# Patient Record
Sex: Female | Born: 1990 | Race: White | Hispanic: No | Marital: Married | Smoking: Never smoker
Health system: Southern US, Community
[De-identification: ages and names within clinical notes are randomized; demographics above are authoritative.]

## PROBLEM LIST (undated history)

## (undated) DIAGNOSIS — F32A Depression, unspecified: Secondary | ICD-10-CM

## (undated) DIAGNOSIS — O139 Gestational [pregnancy-induced] hypertension without significant proteinuria, unspecified trimester: Secondary | ICD-10-CM

## (undated) DIAGNOSIS — O24419 Gestational diabetes mellitus in pregnancy, unspecified control: Secondary | ICD-10-CM

## (undated) DIAGNOSIS — F419 Anxiety disorder, unspecified: Secondary | ICD-10-CM

## (undated) HISTORY — DX: Gestational diabetes mellitus in pregnancy, unspecified control: O24.419

## (undated) HISTORY — DX: Gestational (pregnancy-induced) hypertension without significant proteinuria, unspecified trimester: O13.9

## (undated) HISTORY — DX: Anxiety disorder, unspecified: F41.9

## (undated) HISTORY — DX: Depression, unspecified: F32.A

## (undated) HISTORY — PX: WISDOM TOOTH EXTRACTION: SHX21

---
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
## 2015-09-01 ENCOUNTER — Ambulatory Visit (INDEPENDENT_AMBULATORY_CARE_PROVIDER_SITE_OTHER): Payer: BLUE CROSS/BLUE SHIELD | Admitting: Obstetrics and Gynecology

## 2015-09-01 VITALS — BP 113/81 | HR 91 | Wt 179.2 lb

## 2015-09-01 DIAGNOSIS — N920 Excessive and frequent menstruation with regular cycle: Secondary | ICD-10-CM

## 2015-09-01 DIAGNOSIS — R102 Pelvic and perineal pain: Secondary | ICD-10-CM

## 2015-09-01 DIAGNOSIS — Z30432 Encounter for removal of intrauterine contraceptive device: Secondary | ICD-10-CM | POA: Diagnosis not present

## 2015-09-01 MED ORDER — DROSPIRENONE-ETHINYL ESTRADIOL 3-0.03 MG PO TABS: 1.0000 | ORAL_TABLET | Freq: Every day | ORAL | Status: DC

## 2015-09-01 NOTE — Progress Notes (Signed)
GYNECOLOGY PROCEDURE NOTE  Subjective:    Patient ID: Tina Dixon, female    DOB: 11-12-1991, 24 y.o.   MRN: [id number]  HPI Tina Dixon is a 24 y.o. P0 here for Paraguard IUD removal. Reports that  Since having IUD placed 8 months ago, she has had 4 months of pelvic pain, heavier periods and dysmenorrhea.  Also notes that previously she was able to lose weight when on OCPs, however now, despite working out daily, has had no further weight loss.   The following portions of the patient's history were reviewed and updated as appropriate: allergies, current medications, past family history, past medical history, past social history, past surgical history and problem list.  Review of Systems Pertinent items noted in HPI and remainder of comprehensive ROS otherwise negative.   Objective:   Blood pressure 113/81, pulse 91, weight 179 lb 3 oz (81.279 kg). General appearance: alert and no distress   IUD Removal  Patient identified, informed consent performed, consent signed.  Patient was in the dorsal lithotomy position, normal external genitalia was noted.  A speculum was placed in the patient's vagina, normal discharge was noted, no lesions. The cervix was visualized, no lesions, no abnormal discharge.  The strings of the IUD were grasped and pulled using ring forceps. The IUD was removed in its entirety.  Patient tolerated the procedure well.     Assessment:   Encounter for IUD removal Pelvic pain Heavy menses  Plan:   IUD removed without difficulty. Patient will use OCPs for contraception (has used Yaz in the past and would like to resume)  Routine preventative health maintenance measures emphasized.   Hildred LaserAnika Ericberto Padget, MD [facility]

## 2016-01-08 ENCOUNTER — Emergency Department
Admission: EM | Admit: 2016-01-08 | Discharge: 2016-01-08 | Disposition: A | Discharge status: Home / Self Care | Payer: BLUE CROSS/BLUE SHIELD | Attending: Emergency Medicine | Admitting: Emergency Medicine

## 2016-01-08 ENCOUNTER — Encounter: Payer: Self-pay | Admitting: Emergency Medicine

## 2016-01-08 DIAGNOSIS — R000 Tachycardia, unspecified: Secondary | ICD-10-CM | POA: Insufficient documentation

## 2016-01-08 DIAGNOSIS — R05 Cough: Secondary | ICD-10-CM | POA: Diagnosis present

## 2016-01-08 DIAGNOSIS — Z793 Long term (current) use of hormonal contraceptives: Secondary | ICD-10-CM | POA: Insufficient documentation

## 2016-01-08 DIAGNOSIS — J101 Influenza due to other identified influenza virus with other respiratory manifestations: Secondary | ICD-10-CM

## 2016-01-08 LAB — RAPID INFLUENZA A&B ANTIGENS
Influenza A (ARMC): DETECTED
Influenza B (ARMC): NOT DETECTED

## 2016-01-08 MED ORDER — ACETAMINOPHEN 500 MG PO TABS
ORAL_TABLET | ORAL | Status: AC
  Administered 2016-01-08: 1000 mg via ORAL
  Filled 2016-01-08: qty 2

## 2016-01-08 MED ORDER — PSEUDOEPH-BROMPHEN-DM 30-2-10 MG/5ML PO SYRP: 5.0000 mL | ORAL_SOLUTION | Freq: Four times a day (QID) | ORAL | Status: DC | PRN

## 2016-01-08 MED ORDER — KETOROLAC TROMETHAMINE 60 MG/2ML IM SOLN
60.0000 mg | Freq: Once | INTRAMUSCULAR | Status: AC
  Administered 2016-01-08: 60 mg via INTRAMUSCULAR
  Filled 2016-01-08: qty 2

## 2016-01-08 MED ORDER — OSELTAMIVIR PHOSPHATE 75 MG PO CAPS: 75.0000 mg | ORAL_CAPSULE | Freq: Two times a day (BID) | ORAL | Status: AC

## 2016-01-08 MED ORDER — ACETAMINOPHEN 500 MG PO TABS
1000.0000 mg | ORAL_TABLET | Freq: Once | ORAL | Status: AC
  Administered 2016-01-08: 1000 mg via ORAL

## 2016-01-08 MED ORDER — IBUPROFEN 800 MG PO TABS: 800.0000 mg | ORAL_TABLET | Freq: Three times a day (TID) | ORAL | Status: DC | PRN

## 2016-01-08 NOTE — ED Notes (Signed)
Cough and fever, T 103.2 at home today.

## 2016-01-08 NOTE — Discharge Instructions (Signed)
Influenza, Adult Influenza (flu) is an infection in the mouth, nose, and throat (respiratory tract) caused by a virus. The flu can make you feel very ill. Influenza spreads easily from person to person (contagious).  HOME CARE   Only take medicines as told by your doctor.  Use a cool mist humidifier to make breathing easier.  Get plenty of rest until your fever goes away. This usually takes 3 to 4 days.  Drink enough fluids to keep your pee (urine) clear or pale yellow.  Cover your mouth and nose when you cough or sneeze.  Wash your hands well to avoid spreading the flu.  Stay home from work or school until your fever has been gone for at least 1 full day.  Get a flu shot every year. GET HELP RIGHT AWAY IF:   You have trouble breathing or feel short of breath.  Your skin or nails turn blue.  You have severe neck pain or stiffness.  You have a severe headache, facial pain, or earache.  Your fever gets worse or keeps coming back.  You feel sick to your stomach (nauseous), throw up (vomit), or have watery poop (diarrhea).  You have chest pain.  You have a deep cough that gets worse, or you cough up more thick spit (mucus). MAKE SURE YOU:   Understand these instructions.  Will watch your condition.  Will get help right away if you are not doing well or get worse.   This information is not intended to replace advice given to you by your health care provider. Make sure you discuss any questions you have with your health care provider.   Document Released: 08/08/2008 Document Revised: 11/20/2014 Document Reviewed: 01/29/2012 Elsevier Interactive Patient Education 2016 Elsevier Inc.  

## 2016-01-08 NOTE — ED Provider Notes (Signed)
[facility] Emergency Department Provider Note  ____________________________________________  Time seen: Approximately 8:24 PM  I have reviewed the triage vital signs and the nursing notes.   HISTORY  Chief Complaint Cough    HPI Tina Dixon is a 25 y.o. female patient complain of fever sinus and chest congestion and body aches and a nonproductive cough. He also complaining of sore throat. Onset of complaint was last night. Patient denies any nausea vomiting diarrhea.No palliative measures taken for this complaint prior to arrival to the ER. Patient given Tylenol in triage. Patient states she did not take the flu shot this season. Patient rates her pain discomfort as a 5/10. Patient describes the pain as "achy".   History reviewed. No pertinent past medical history.  There are no active problems to display for this patient.   History reviewed. No pertinent past surgical history.  Current Outpatient Rx  Name  Route  Sig  Dispense  Refill  . brompheniramine-pseudoephedrine-DM 30-2-10 MG/5ML syrup   Oral   Take 5 mLs by mouth 4 (four) times daily as needed.   120 mL   0   . drospirenone-ethinyl estradiol (YASMIN 28) 3-0.03 MG tablet   Oral   Take 1 tablet by mouth daily.   1 Package   11   . ibuprofen (ADVIL,MOTRIN) 800 MG tablet   Oral   Take 1 tablet (800 mg total) by mouth every 8 (eight) hours as needed.   30 tablet   0   . oseltamivir (TAMIFLU) 75 MG capsule   Oral   Take 1 capsule (75 mg total) by mouth 2 (two) times daily.   20 capsule   0     Allergies Review of patient's allergies indicates no known allergies.  No family history on file.  Social History Social History  Substance Use Topics  . Smoking status: Never Smoker   . Smokeless tobacco: None  . Alcohol Use: No    Review of Systems Constitutional: Fever and chills. Body ache Eyes: No visual changes. ENT: Sore throat  Cardiovascular: Denies chest  pain. Respiratory: Denies shortness of breath. Nonproductive cough Gastrointestinal: No abdominal pain.  No nausea, no vomiting.  No diarrhea.  No constipation. Genitourinary: Negative for dysuria. Musculoskeletal: Negative for back pain. Skin: Negative for rash. Neurological: Positive for headaches, but denies focal weakness or numbness.    ____________________________________________   PHYSICAL EXAM:  VITAL SIGNS: ED Triage Vitals  Enc Vitals Group     BP 01/08/16 1901 122/74 mmHg     Pulse Rate 01/08/16 1901 130     Resp 01/08/16 1901 20     Temp 01/08/16 1901 102.3 F (39.1 C)     Temp Source 01/08/16 1901 Oral     SpO2 01/08/16 1901 95 %     Weight 01/08/16 1901 179 lb (81.194 kg)     Height 01/08/16 1901  (1.6 m)     Head Cir --      Peak Flow --      Pain Score 01/08/16 1903 5     Pain Loc --      Pain Edu? --      Excl. in GC? --     Constitutional: Alert and oriented. Well appearing and in no acute distress. Patient was febrile in triage status post Tylenol fever resolved. Eyes: Conjunctivae are normal. PERRL. EOMI. Head: Atraumatic. Nose: No congestion/rhinnorhea. Mouth/Throat: Mucous membranes are moist.  Oropharynx non-erythematous. Neck: No stridor.  No cervical spine tenderness to  palpation. Hematological/Lymphatic/Immunilogical: No cervical lymphadenopathy. Cardiovascular: Normal rate, regular rhythm. Grossly normal heart sounds.  Good peripheral circulation. Patient initially tachycardic status post Tylenol resistant fever pulse is now 109. Respiratory: Normal respiratory effort.  No retractions. Lungs CTAB. Gastrointestinal: Soft and nontender. No distention. No abdominal bruits. No CVA tenderness. Musculoskeletal: No lower extremity tenderness nor edema.  No joint effusions. Neurologic:  Normal speech and language. No gross focal neurologic deficits are appreciated. No gait instability. Skin:  Skin is warm, dry and intact. No rash  noted. Psychiatric: Mood and affect are normal. Speech and behavior are normal.  ____________________________________________   LABS (all labs ordered are listed, but only abnormal results are displayed)  Labs Reviewed  RAPID INFLUENZA A&B ANTIGENS ([facility] ONLY)   ____________________________________________  EKG   ____________________________________________  RADIOLOGY   ____________________________________________   PROCEDURES  Procedure(s) performed: None  Critical Care performed: No  ____________________________________________   INITIAL IMPRESSION / ASSESSMENT AND PLAN / ED COURSE  Pertinent labs & imaging results that were available during my care of the patient were reviewed by me and considered in my medical decision making (see chart for details).  Positive for influenza A. She given discharge care instructions. Patient given a prescription for Tamiflu, Bromfed-DM, and ibuprofen. Patient given a work excuse for 2 days. Patient advised [facility] no [facility] in 3-5 days. ____________________________________________   FINAL CLINICAL IMPRESSION(S) / ED DIAGNOSES  Final diagnoses:  Influenza A      Joni Reining, PA-C 01/08/16 2121  Jennye Moccasin, MD 01/08/16 2123

## 2016-01-08 NOTE — ED Notes (Addendum)
Pt c/o of fever (103), chest congestion, sinus congestion, SOB, generalized body aches and previous dry cough.

## 2016-02-15 DIAGNOSIS — Z3009 Encounter for other general counseling and advice on contraception: Secondary | ICD-10-CM | POA: Insufficient documentation

## 2016-02-16 ENCOUNTER — Ambulatory Visit (INDEPENDENT_AMBULATORY_CARE_PROVIDER_SITE_OTHER): Payer: BLUE CROSS/BLUE SHIELD | Admitting: Unknown Physician Specialty

## 2016-02-16 ENCOUNTER — Encounter: Payer: Self-pay | Admitting: Unknown Physician Specialty

## 2016-02-16 VITALS — BP 128/89 | HR 97 | Temp 98.5°F | Ht 61.8 in | Wt 176.4 lb

## 2016-02-16 DIAGNOSIS — F329 Major depressive disorder, single episode, unspecified: Secondary | ICD-10-CM

## 2016-02-16 DIAGNOSIS — F322 Major depressive disorder, single episode, severe without psychotic features: Secondary | ICD-10-CM | POA: Insufficient documentation

## 2016-02-16 MED ORDER — CITALOPRAM HYDROBROMIDE 20 MG PO TABS: 20.0000 mg | ORAL_TABLET | Freq: Every day | ORAL | Status: DC

## 2016-02-16 MED ORDER — LORAZEPAM 0.5 MG PO TABS: 0.5000 mg | ORAL_TABLET | Freq: Two times a day (BID) | ORAL | Status: DC | PRN

## 2016-02-16 NOTE — Progress Notes (Signed)
   BP 128/89 mmHg  Pulse 97  Temp(Src) 98.5 F (36.9 C)  Ht 5' 1.8" (1.57 m)  Wt 176 lb 6.4 oz (80.015 kg)  BMI 32.46 kg/m2  SpO2 99%  LMP 02/02/2016 (Approximate)   Subjective:    Patient ID: Tina Dixon, female    DOB: 12/29/1990, 25 y.o.   MRN: [id number]  HPI: Tina Dixon is a 25 y.o. female  Chief Complaint  Patient presents with  . Depression  . Anxiety   Depression Pt feeling down and hopeless for about a year.  She states her husband left her in the last 2 days but denies any marital problems before this.  She states she has no suicidal ideation.  She never had this feeling before the past year.    Depression screen PHQ 2/9 02/16/2016  Decreased Interest 3  Down, Depressed, Hopeless 3  PHQ - 2 Score 6  Altered sleeping 3  Tired, decreased energy 3  Change in appetite 2  Feeling bad or failure about yourself  3  Trouble concentrating 3  Moving slowly or fidgety/restless 2  Suicidal thoughts 0  PHQ-9 Score 22      Relevant past medical, surgical, family and social history reviewed and updated as indicated. Interim medical history since our last visit reviewed. Allergies and medications reviewed and updated.  Review of Systems  Per HPI unless specifically indicated above     Objective:    BP 128/89 mmHg  Pulse 97  Temp(Src) 98.5 F (36.9 C)  Ht 5' 1.8" (1.57 m)  Wt 176 lb 6.4 oz (80.015 kg)  BMI 32.46 kg/m2  SpO2 99%  LMP 02/02/2016 (Approximate)  Wt Readings from Last 3 Encounters:  02/16/16 176 lb 6.4 oz (80.015 kg)  11/17/14 150 lb (68.04 kg)  01/08/16 179 lb (81.194 kg)    Physical Exam  Constitutional: She is oriented to person, place, and time. She appears well-developed and well-nourished. No distress.  HENT:  Head: Normocephalic and atraumatic.  Eyes: Conjunctivae and lids are normal. Right eye exhibits no discharge. Left eye exhibits no discharge. No scleral icterus.  Cardiovascular: Normal rate.   Pulmonary/Chest: Effort  normal.  Abdominal: Normal appearance. There is no splenomegaly or hepatomegaly.  Musculoskeletal: Normal range of motion.  Neurological: She is alert and oriented to person, place, and time.  Skin: Skin is intact. No rash noted. No pallor.  Psychiatric: Her speech is normal and behavior is normal. Judgment and thought content normal. Cognition and memory are normal. She exhibits a depressed mood.       Assessment & Plan:   Problem List Items Addressed This Visit      Unprioritized   Severe depression - Primary    Discussed counseling.  Start Citalopram 20 mg daily.  Rx for Lorazepam .5 mg for night time use      Relevant Medications   citalopram (CELEXA) 20 MG tablet   LORazepam (ATIVAN) 0.5 MG tablet       Follow up plan: Return in about 4 weeks (around 03/15/2016).

## 2016-02-16 NOTE — Patient Instructions (Signed)
Go to Psychology Today and Find a Counselor website Consider [facility]

## 2016-02-16 NOTE — Assessment & Plan Note (Signed)
Discussed counseling.  Start Citalopram 20 mg daily.  Rx for Lorazepam .5 mg for night time use

## 2016-03-15 ENCOUNTER — Ambulatory Visit: Payer: BLUE CROSS/BLUE SHIELD | Admitting: Unknown Physician Specialty

## 2016-06-09 ENCOUNTER — Encounter: Payer: Self-pay | Admitting: Unknown Physician Specialty

## 2016-06-09 ENCOUNTER — Ambulatory Visit (INDEPENDENT_AMBULATORY_CARE_PROVIDER_SITE_OTHER): Payer: Medicaid Other | Admitting: Unknown Physician Specialty

## 2016-06-09 VITALS — BP 112/81 | HR 91 | Temp 98.3°F | Ht 63.1 in | Wt 168.0 lb

## 2016-06-09 DIAGNOSIS — Z202 Contact with and (suspected) exposure to infections with a predominantly sexual mode of transmission: Secondary | ICD-10-CM | POA: Diagnosis not present

## 2016-06-09 DIAGNOSIS — Z3009 Encounter for other general counseling and advice on contraception: Secondary | ICD-10-CM

## 2016-06-09 NOTE — Progress Notes (Signed)
   BP 112/81 (BP Location: Left Arm, Patient Position: Sitting, Cuff Size: Normal)   Pulse 91   Temp 98.3 F (36.8 C)   Ht 5' 3.1" (1.603 m)   Wt 168 lb (76.2 kg)   LMP 06/03/2016 (Exact Date)   SpO2 99%   BMI 29.67 kg/m    Subjective:    Patient ID: Tina Dixon, female    DOB: 1991/04/08, 25 y.o.   MRN: [id number]  HPI: Tina Dixon is a 25 y.o. female  Chief Complaint  Patient presents with  . birth control change    interested in implant   Pt states she had a copper IUD which was removed due to multiple issues such as bleeding.  She does not have problems with BCPs but can't remember to take them.  She is interested in getting a Nexplanon.  Pt states her husband was unfaithful and would like to get STD checks.    Relevant past medical, surgical, family and social history reviewed and updated as indicated. Interim medical history since our last visit reviewed. Allergies and medications reviewed and updated.  Review of Systems  Per HPI unless specifically indicated above     Objective:    BP 112/81 (BP Location: Left Arm, Patient Position: Sitting, Cuff Size: Normal)   Pulse 91   Temp 98.3 F (36.8 C)   Ht 5' 3.1" (1.603 m)   Wt 168 lb (76.2 kg)   LMP 06/03/2016 (Exact Date)   SpO2 99%   BMI 29.67 kg/m   Wt Readings from Last 3 Encounters:  06/09/16 168 lb (76.2 kg)  02/16/16 176 lb 6.4 oz (80 kg)  11/17/14 150 lb (68 kg)    Physical Exam  Constitutional: She is oriented to person, place, and time. She appears well-developed and well-nourished. No distress.  HENT:  Head: Normocephalic and atraumatic.  Eyes: Conjunctivae and lids are normal. Right eye exhibits no discharge. Left eye exhibits no discharge. No scleral icterus.  Cardiovascular: Normal rate.   Pulmonary/Chest: Effort normal.  Abdominal: Normal appearance. There is no splenomegaly or hepatomegaly.  Musculoskeletal: Normal range of motion.  Neurological: She is alert and oriented to person,  place, and time.  Skin: Skin is intact. No rash noted. No pallor.  Psychiatric: She has a normal mood and affect. Her behavior is normal. Judgment and thought content normal.    Results for orders placed or performed during the hospital encounter of 01/08/16  Rapid Influenza A&B Antigens ([facility] only)  Result Value Ref Range   Influenza A ([facility]) DETECTED    Influenza B [facility]) NOT DETECTED       Assessment & Plan:   Problem List Items Addressed This Visit      Unprioritized   Family planning counseling    Refer to Gyn for Nexplanon      Relevant Orders   Ambulatory referral to Gynecology    Other Visit Diagnoses    Possible exposure to STD    -  Primary   Relevant Orders   GC/Chlamydia Probe Amp   HIV antibody   RPR   HSV(herpes simplex vrs) 1+2 ab-IgG       Follow up plan: Return in about 1 year (around 06/09/2017).

## 2016-06-09 NOTE — Assessment & Plan Note (Signed)
Refer to Gyn for Nexplanon

## 2016-06-10 LAB — HSV(HERPES SIMPLEX VRS) I + II AB-IGG
HSV 1 Glycoprotein G Ab, IgG: <0.91 index (ref 0.00–0.90)
HSV 2 Glycoprotein G Ab, IgG: <0.91 index (ref 0.00–0.90)

## 2016-06-10 LAB — RPR: RPR Ser Ql: NONREACTIVE

## 2016-06-10 LAB — HIV ANTIBODY (ROUTINE TESTING W REFLEX): HIV Screen 4th Generation wRfx: NONREACTIVE

## 2016-06-13 LAB — GC/CHLAMYDIA PROBE AMP
Chlamydia trachomatis, NAA: NEGATIVE
Neisseria gonorrhoeae by PCR: NEGATIVE

## 2016-06-14 NOTE — Progress Notes (Signed)
Notified pt by mychart

## 2016-08-03 ENCOUNTER — Other Ambulatory Visit: Payer: Self-pay | Admitting: Obstetrics and Gynecology

## 2016-08-03 NOTE — Telephone Encounter (Signed)
Patient needs to schedule an appointment in the office as it has been almost a year since last visit.

## 2016-08-16 ENCOUNTER — Ambulatory Visit: Payer: Medicaid Other | Admitting: Obstetrics and Gynecology

## 2016-10-20 ENCOUNTER — Encounter: Payer: Self-pay | Admitting: Unknown Physician Specialty

## 2016-10-20 ENCOUNTER — Ambulatory Visit (INDEPENDENT_AMBULATORY_CARE_PROVIDER_SITE_OTHER): Payer: Self-pay | Admitting: Unknown Physician Specialty

## 2016-10-20 VITALS — BP 119/79 | HR 80 | Temp 97.5°F | Ht 62.5 in | Wt 176.2 lb

## 2016-10-20 DIAGNOSIS — Z23 Encounter for immunization: Secondary | ICD-10-CM

## 2016-10-20 DIAGNOSIS — N760 Acute vaginitis: Secondary | ICD-10-CM

## 2016-10-20 MED ORDER — FLUCONAZOLE 150 MG PO TABS: 150.0000 mg | ORAL_TABLET | Freq: Once | ORAL | 0 refills | Status: AC

## 2016-10-20 NOTE — Progress Notes (Signed)
BP 119/79 (BP Location: Left Arm, Patient Position: Sitting, Cuff Size: Large)   Pulse 80   Temp 97.5 F (36.4 C)   Ht 5' 2.5" (1.588 m)   Wt 176 lb 3.2 oz (79.9 kg)   LMP 09/04/2016 (Approximate)   SpO2 100%   BMI 31.71 kg/m    Subjective:    Patient ID: Tina Dixon, female    DOB: [birth year], 25 y.o.   MRN: [id number]  HPI: Tina Dixon is a 25 y.o. female  Chief Complaint  Patient presents with  . Vaginal Discharge    brownish color discharge for 1 week   . Vaginal Itching    for 1 week   Vaginal discharge Pt with irregular menses and a dark brown discharge like "it's at the end of my period."  She is also experiencing vaginal itching.  Nexplanon placed a few months ago.  She has a new partner who she says was "tested as clean."  No pain with intercourse.    Relevant past medical, surgical, family and social history reviewed and updated as indicated. Interim medical history since our last visit reviewed. Allergies and medications reviewed and updated.  Review of Systems  Per HPI unless specifically indicated above     Objective:    BP 119/79 (BP Location: Left Arm, Patient Position: Sitting, Cuff Size: Large)   Pulse 80   Temp 97.5 F (36.4 C)   Ht 5' 2.5" (1.588 m)   Wt 176 lb 3.2 oz (79.9 kg)   LMP 09/04/2016 (Approximate)   SpO2 100%   BMI 31.71 kg/m   Wt Readings from Last 3 Encounters:  10/20/16 176 lb 3.2 oz (79.9 kg)  06/09/16 168 lb (76.2 kg)  02/16/16 176 lb 6.4 oz (80 kg)    Physical Exam  Constitutional: She is oriented to person, place, and time. She appears well-developed and well-nourished. No distress.  HENT:  Head: Normocephalic and atraumatic.  Eyes: Conjunctivae and lids are normal. Right eye exhibits no discharge. Left eye exhibits no discharge. No scleral icterus.  Cardiovascular: Normal rate.   Pulmonary/Chest: Effort normal.  Abdominal: Normal appearance. There is no splenomegaly or hepatomegaly.  Genitourinary: Cervix  exhibits discharge. There is erythema in the vagina.  Genitourinary Comments: Blood discharge.    Musculoskeletal: Normal range of motion.  Neurological: She is alert and oriented to person, place, and time.  Skin: Skin is intact. No rash noted. No pallor.  Psychiatric: She has a normal mood and affect. Her behavior is normal. Judgment and thought content normal.    Results for orders placed or performed in visit on 06/09/16  GC/Chlamydia Probe Amp  Result Value Ref Range   Chlamydia trachomatis, NAA Negative Negative   Neisseria gonorrhoeae by PCR Negative Negative  HIV antibody  Result Value Ref Range   HIV Screen 4th Generation wRfx Non Reactive Non Reactive  RPR  Result Value Ref Range   RPR Ser Ql Non Reactive Non Reactive  HSV(herpes simplex vrs) 1+2 ab-IgG  Result Value Ref Range   HSV 1 Glycoprotein G Ab, IgG <0.91 0.00 - 0.90 index   HSV 2 Glycoprotein G Ab, IgG <0.91 0.00 - 0.90 index       Assessment & Plan:   Problem List Items Addressed This Visit    None    Visit Diagnoses    Need for influenza vaccination    -  Primary   Relevant Orders   Flu Vaccine QUAD 36+ mos IM (Completed)  Acute vaginitis       Pt reassured that the discharge is normal s/p Nexplanon.  Rx for Diflucan       Follow up plan: Return if symptoms worsen or fail to improve.

## 2016-10-20 NOTE — Patient Instructions (Signed)

## 2017-06-15 ENCOUNTER — Encounter: Payer: Self-pay | Admitting: Unknown Physician Specialty

## 2017-07-18 ENCOUNTER — Encounter: Payer: Self-pay | Admitting: Obstetrics and Gynecology

## 2017-07-18 ENCOUNTER — Ambulatory Visit (INDEPENDENT_AMBULATORY_CARE_PROVIDER_SITE_OTHER): Payer: No Typology Code available for payment source | Admitting: Obstetrics and Gynecology

## 2017-07-18 VITALS — BP 129/84 | HR 110 | Ht 63.0 in | Wt 192.6 lb

## 2017-07-18 DIAGNOSIS — Z3046 Encounter for surveillance of implantable subdermal contraceptive: Secondary | ICD-10-CM | POA: Diagnosis not present

## 2017-07-18 MED ORDER — DROSPIRENONE-ETHINYL ESTRADIOL 3-0.02 MG PO TABS: 1.0000 | ORAL_TABLET | Freq: Every day | ORAL | 11 refills | Status: DC

## 2017-07-18 NOTE — Patient Instructions (Addendum)
NEXPLANON PLACEMENT POST-PROCEDURE INSTRUCTIONS  1. You may take Ibuprofen, Aleve or Tylenol for pain if needed.  Pain should resolve within in 24 hours.  You may have intercourse after 24 hours. You should begin using your pills immediately.   2. You need to call if you have any fever, heavy bleeding, or redness at removal site. Irregular bleeding is common  after having a Nexplanon removed. You do not need to call for this reason unless you are concerned.  3. Shower or bathe as normal.  You can remove the bandage after 24 hours. 4.

## 2017-07-18 NOTE — Progress Notes (Signed)
     GYNECOLOGY OFFICE PROCEDURE NOTE  Tina Dixon is a 26 y.o. G0P0000 here for Nexplanon removal.  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 Last pap smear was ~ 2 years ago and was normal.  No other gynecologic concerns.   Nexplanon Removal Patient identified, informed consent performed, consent signed.   Appropriate time out taken. Nexplanon site identified in left arm.  Area prepped in usual sterile fashon. Two ml of 1% lidocaine was used to anesthetize the area at the distal end of the implant. A small stab incision was made right beside the implant on the distal portion.  The Nexplanon rod was grasped using hemostats and removed without difficulty.  There was minimal blood loss. There were no complications.  Steri-strips were applied over the small incision.  A pressure bandage was applied to reduce any bruising.  The patient tolerated the procedure well and was given post procedure instructions.  Patient is planning to use combined OCPs for contraception.  Prescription sent to pharmacy.

## 2018-05-20 ENCOUNTER — Encounter: Payer: Self-pay | Admitting: Unknown Physician Specialty

## 2018-05-20 ENCOUNTER — Ambulatory Visit: Payer: BLUE CROSS/BLUE SHIELD | Admitting: Unknown Physician Specialty

## 2018-05-20 VITALS — BP 128/87 | HR 87 | Temp 99.0°F | Ht 63.0 in | Wt 187.6 lb

## 2018-05-20 DIAGNOSIS — Z3009 Encounter for other general counseling and advice on contraception: Secondary | ICD-10-CM | POA: Diagnosis not present

## 2018-05-20 DIAGNOSIS — Z23 Encounter for immunization: Secondary | ICD-10-CM

## 2018-05-20 DIAGNOSIS — R5383 Other fatigue: Secondary | ICD-10-CM | POA: Diagnosis not present

## 2018-05-20 DIAGNOSIS — F324 Major depressive disorder, single episode, in partial remission: Secondary | ICD-10-CM | POA: Diagnosis not present

## 2018-05-20 MED ORDER — SPRINTEC 28 0.25-35 MG-MCG PO TABS: 1.0000 | ORAL_TABLET | Freq: Every day | ORAL | 4 refills | Status: DC

## 2018-05-20 NOTE — Patient Instructions (Signed)
Td Vaccine (Tetanus and Diphtheria): What You Need to Know 1. Why get vaccinated? Tetanus  and diphtheria are very serious diseases. They are rare in the [place] today, but people who do become infected often have severe complications. Td vaccine is used to protect adolescents and adults from both of these diseases. Both tetanus and diphtheria are infections caused by bacteria. Diphtheria spreads from person to person through coughing or sneezing. Tetanus-causing bacteria enter the body through cuts, scratches, or wounds. TETANUS (lockjaw) causes painful muscle tightening and stiffness, usually all over the body.  It can lead to tightening of muscles in the head and neck so you can't open your mouth, swallow, or sometimes even breathe. Tetanus kills about 1 out of every 10 people who are infected even after receiving the best medical care.  DIPHTHERIA can cause a thick coating to form in the back of the throat.  It can lead to breathing problems, paralysis, heart failure, and death.  Before vaccines, as many as 200,000 cases of diphtheria and hundreds of cases of tetanus were reported in the [place] each year. Since vaccination began, reports of cases for both diseases have dropped by about 99%. 2. Td vaccine Td vaccine can protect adolescents and adults from tetanus and diphtheria. Td is usually given as a booster dose every 10 years but it can also be given earlier after a severe and dirty wound or burn. Another vaccine, called Tdap, which protects against pertussis in addition to tetanus and diphtheria, is sometimes recommended instead of Td vaccine. Your doctor or the person giving you the vaccine can give you more information. Td may safely be given at the same time as other vaccines. 3. Some people should not get this vaccine  A person who has ever had a life-threatening allergic reaction after a previous dose of any tetanus or diphtheria containing vaccine, OR has a severe  allergy to any part of this vaccine, should not get Td vaccine. Tell the person giving the vaccine about any severe allergies.  Talk to your doctor if you: ? had severe pain or swelling after any vaccine containing diphtheria or tetanus, ? ever had a condition called Guillain Barre Syndrome (GBS), ? aren't feeling well on the day the shot is scheduled. 4. What are the risks from Td vaccine? With any medicine, including vaccines, there is a chance of side effects. These are usually mild and go away on their own. Serious reactions are also possible but are rare. Most people who get Td vaccine do not have any problems with it. Mild problems following Td vaccine: (Did not interfere with activities)  Pain where the shot was given (about 8 people in 10)  Redness or swelling where the shot was given (about 1 person in 4)  Mild fever (rare)  Headache (about 1 person in 4)  Tiredness (about 1 person in 4)  Moderate problems following Td vaccine: (Interfered with activities, but did not require medical attention)  Fever over 102F (rare)  Severe problems following Td vaccine: (Unable to perform usual activities; required medical attention)  Swelling, severe pain, bleeding and/or redness in the arm where the shot was given (rare).  Problems that could happen after any vaccine:  People sometimes faint after a medical procedure, including vaccination. Sitting or lying down for about 15 minutes can help prevent fainting, and injuries caused by a fall. Tell your doctor if you feel dizzy, or have vision changes or ringing in the ears.  Some people get   severe pain in the shoulder and have difficulty moving the arm where a shot was given. This happens very rarely.  Any medication can cause a severe allergic reaction. Such reactions from a vaccine are very rare, estimated at fewer than 1 in a million doses, and would happen within a few minutes to a few hours after the vaccination. As with any  medicine, there is a very remote chance of a vaccine causing a serious injury or death. The safety of vaccines is always being monitored. For more information, visit: www.cdc.gov/vaccinesafety/ 5. What if there is a serious reaction? What should I look for? Look for anything that concerns you, such as signs of a severe allergic reaction, very high fever, or unusual behavior. Signs of a severe allergic reaction can include hives, swelling of the face and throat, difficulty breathing, a fast heartbeat, dizziness, and weakness. These would usually start a few minutes to a few hours after the vaccination. What should I do?  If you think it is a severe allergic reaction or other emergency that can't wait, call 9-1-1 or get the person to the nearest hospital. Otherwise, call your doctor.  Afterward, the reaction should be reported to the Vaccine Adverse Event Reporting System (VAERS). Your doctor might file this report, or you can do it yourself through the VAERS web site at www.vaers.hhs.gov, or by calling 1-800-822-7967. ? VAERS does not give medical advice. 6. The National Vaccine Injury Compensation Program The National Vaccine Injury Compensation Program (VICP) is a federal program that was created to compensate people who may have been injured by certain vaccines. Persons who believe they may have been injured by a vaccine can learn about the program and about filing a claim by calling 1-800-338-2382 or visiting the VICP website at www.hrsa.gov/vaccinecompensation. There is a time limit to file a claim for compensation. 7. How can I learn more?  Ask your doctor. He or she can give you the vaccine package insert or suggest other sources of information.  Call your local or state health department.  Contact the Centers for Disease Control and Prevention (CDC): ? Call 1-800-232-4636 (1-800-CDC-INFO) ? Visit CDC's website at www.cdc.gov/vaccines CDC Td Vaccine VIS (02/22/16) This information is  not intended to replace advice given to you by your health care provider. Make sure you discuss any questions you have with your health care provider. Document Released: 08/27/2006 Document Revised: 07/20/2016 Document Reviewed: 07/20/2016 Elsevier Interactive Patient Education  2017 Elsevier Inc.  
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
## 2018-05-20 NOTE — Assessment & Plan Note (Addendum)
Significant fatigue for 2 years but new problem for me.  Check labs and f/u with physical

## 2018-05-20 NOTE — Assessment & Plan Note (Signed)
Refill BCPs.  Schedule for a PE

## 2018-05-20 NOTE — Assessment & Plan Note (Signed)
Wants no medications at this time. Encouraged counseling through company or insurance

## 2018-05-21 LAB — CBC WITH DIFFERENTIAL/PLATELET
Basophils Absolute: 0.1 10*3/uL (ref 0.0–0.2)
Basos: 1 %
EOS (ABSOLUTE): 0.2 10*3/uL (ref 0.0–0.4)
Eos: 2 %
Hematocrit: 42.1 % (ref 34.0–46.6)
Hemoglobin: 14 g/dL (ref 11.1–15.9)
Immature Grans (Abs): 0 10*3/uL (ref 0.0–0.1)
Immature Granulocytes: 0 %
Lymphocytes Absolute: 2.4 10*3/uL (ref 0.7–3.1)
Lymphs: 27 %
MCH: 31.5 pg (ref 26.6–33.0)
MCHC: 33.3 g/dL (ref 31.5–35.7)
MCV: 95 fL (ref 79–97)
Monocytes Absolute: 0.7 10*3/uL (ref 0.1–0.9)
Monocytes: 9 %
Neutrophils Absolute: 5.4 10*3/uL (ref 1.4–7.0)
Neutrophils: 61 %
Platelets: 423 10*3/uL (ref 150–450)
RBC: 4.44 x10E6/uL (ref 3.77–5.28)
RDW: 12.6 % (ref 12.3–15.4)
WBC: 8.7 10*3/uL (ref 3.4–10.8)

## 2018-05-21 LAB — LIPID PANEL W/O CHOL/HDL RATIO
Cholesterol, Total: 182 mg/dL (ref 100–199)
HDL: 82 mg/dL (ref >39)
LDL Calculated: 68 mg/dL (ref 0–99)
Triglycerides: 159 mg/dL — ABNORMAL HIGH (ref 0–149)
VLDL Cholesterol Cal: 32 mg/dL (ref 5–40)

## 2018-05-21 LAB — COMPREHENSIVE METABOLIC PANEL
ALT: 19 IU/L (ref 0–32)
AST: 15 IU/L (ref 0–40)
Albumin/Globulin Ratio: 1.2 (ref 1.2–2.2)
Albumin: 4.1 g/dL (ref 3.5–5.5)
Alkaline Phosphatase: 55 IU/L (ref 39–117)
BUN/Creatinine Ratio: 10 (ref 9–23)
BUN: 8 mg/dL (ref 6–20)
Bilirubin Total: 0.3 mg/dL (ref 0.0–1.2)
CO2: 21 mmol/L (ref 20–29)
Calcium: 9.4 mg/dL (ref 8.7–10.2)
Chloride: 102 mmol/L (ref 96–106)
Creatinine, Ser: 0.77 mg/dL (ref 0.57–1.00)
GFR calc Af Amer: 123 mL/min/{1.73_m2} (ref >59)
GFR calc non Af Amer: 107 mL/min/{1.73_m2} (ref >59)
Globulin, Total: 3.3 g/dL (ref 1.5–4.5)
Glucose: 77 mg/dL (ref 65–99)
Potassium: 4.3 mmol/L (ref 3.5–5.2)
Sodium: 140 mmol/L (ref 134–144)
Total Protein: 7.4 g/dL (ref 6.0–8.5)

## 2018-05-21 LAB — VITAMIN D 25 HYDROXY (VIT D DEFICIENCY, FRACTURES): Vit D, 25-Hydroxy: 27.7 ng/mL — ABNORMAL LOW (ref 30.0–100.0)

## 2018-05-21 LAB — TSH: TSH: 2.03 u[IU]/mL (ref 0.450–4.500)

## 2018-05-21 NOTE — Progress Notes (Signed)
Notified pt by mychart

## 2018-05-24 ENCOUNTER — Ambulatory Visit: Payer: No Typology Code available for payment source | Admitting: Unknown Physician Specialty

## 2018-06-27 ENCOUNTER — Encounter: Payer: BLUE CROSS/BLUE SHIELD | Admitting: Family Medicine

## 2018-09-27 ENCOUNTER — Encounter: Payer: Self-pay | Admitting: Unknown Physician Specialty

## 2018-09-27 ENCOUNTER — Ambulatory Visit (INDEPENDENT_AMBULATORY_CARE_PROVIDER_SITE_OTHER): Payer: BLUE CROSS/BLUE SHIELD | Admitting: Unknown Physician Specialty

## 2018-09-27 VITALS — BP 143/94 | HR 94 | Temp 98.3°F | Wt 189.0 lb

## 2018-09-27 DIAGNOSIS — R519 Headache, unspecified: Secondary | ICD-10-CM

## 2018-09-27 DIAGNOSIS — R51 Headache: Secondary | ICD-10-CM | POA: Diagnosis not present

## 2018-09-27 DIAGNOSIS — J00 Acute nasopharyngitis [common cold]: Secondary | ICD-10-CM | POA: Diagnosis not present

## 2018-09-27 NOTE — Progress Notes (Signed)
BP (!) 143/94   Pulse 94   Temp 98.3 F (36.8 C) (Oral)   Wt 189 lb (85.7 kg)   SpO2 99%   BMI 33.48 kg/m    Subjective:    Patient ID: Tina Dixon, female    DOB: [birth year], 27 y.o.   MRN: [id number]  HPI: Tina Dixon is a 27 y.o. female  Chief Complaint  Patient presents with  . Headache    x days ago  . Sore Throat    Work requires DRs note Was out yesterday and today   URI   This is a new problem. Episode onset: Headache started 3 days ago and sore throat yesterday and nasal congestion following sore throat. There has been no fever. The fever has been present for less than 1 day. Associated symptoms include congestion, coughing, ear pain, headaches, rhinorrhea and a sore throat. Pertinent negatives include no abdominal pain, chest pain, diarrhea, dysuria, joint pain, joint swelling, nausea, neck pain, plugged ear sensation, rash, sinus pain, sneezing, vomiting or wheezing. She has tried acetaminophen for the symptoms. The treatment provided mild relief.    Relevant past medical, surgical, family and social history reviewed and updated as indicated. Interim medical history since our last visit reviewed. Allergies and medications reviewed and updated.  Review of Systems  HENT: Positive for congestion, ear pain, rhinorrhea and sore throat. Negative for sinus pain and sneezing.   Respiratory: Positive for cough. Negative for wheezing.   Cardiovascular: Negative for chest pain.  Gastrointestinal: Negative for abdominal pain, diarrhea, nausea and vomiting.  Genitourinary: Negative for dysuria.  Musculoskeletal: Negative for joint pain and neck pain.  Skin: Negative for rash.  Neurological: Positive for headaches.       Headache worsens with bending forward    Per HPI unless specifically indicated above     Objective:    BP (!) 143/94   Pulse 94   Temp 98.3 F (36.8 C) (Oral)   Wt 189 lb (85.7 kg)   SpO2 99%   BMI 33.48 kg/m   Wt Readings from Last 3  Encounters:  09/27/18 189 lb (85.7 kg)  05/20/18 187 lb 9.6 oz (85.1 kg)  07/18/17 192 lb 9.6 oz (87.4 kg)    Physical Exam  Constitutional: She is oriented to person, place, and time. She appears well-developed and well-nourished. No distress.  HENT:  Head: Normocephalic and atraumatic.  Right Ear: Tympanic membrane and ear canal normal.  Left Ear: Tympanic membrane and ear canal normal.  Nose: Rhinorrhea present. Right sinus exhibits no maxillary sinus tenderness and no frontal sinus tenderness. Left sinus exhibits no maxillary sinus tenderness and no frontal sinus tenderness.  Mouth/Throat: Mucous membranes are normal. Posterior oropharyngeal erythema present.  Eyes: Conjunctivae and lids are normal. Right eye exhibits no discharge. Left eye exhibits no discharge. No scleral icterus.  Cardiovascular: Normal rate and regular rhythm.  Pulmonary/Chest: Effort normal and breath sounds normal. No respiratory distress.  Abdominal: Normal appearance. There is no splenomegaly or hepatomegaly.  Musculoskeletal: Normal range of motion.  Neurological: She is alert and oriented to person, place, and time.  Skin: Skin is intact. No rash noted. No pallor.  Psychiatric: She has a normal mood and affect. Her behavior is normal. Judgment and thought content normal.    Results for orders placed or performed in visit on 05/20/18  CBC with Differential/Platelet  Result Value Ref Range   WBC 8.7 3.4 - 10.8 x10E3/uL   RBC 4.44 3.77 - 5.28 x10E6/uL  Hemoglobin 14.0 11.1 - 15.9 g/dL   Hematocrit 16.142.1 09.634.0 - 46.6 %   MCV 95 79 - 97 fL   MCH 31.5 26.6 - 33.0 pg   MCHC 33.3 31.5 - 35.7 g/dL   RDW 04.512.6 40.912.3 - 81.115.4 %   Platelets 423 150 - 450 x10E3/uL   Neutrophils 61 Not Estab. %   Lymphs 27 Not Estab. %   Monocytes 9 Not Estab. %   Eos 2 Not Estab. %   Basos 1 Not Estab. %   Neutrophils Absolute 5.4 1.4 - 7.0 x10E3/uL   Lymphocytes Absolute 2.4 0.7 - 3.1 x10E3/uL   Monocytes Absolute 0.7 0.1 - 0.9  x10E3/uL   EOS (ABSOLUTE) 0.2 0.0 - 0.4 x10E3/uL   Basophils Absolute 0.1 0.0 - 0.2 x10E3/uL   Immature Granulocytes 0 Not Estab. %   Immature Grans (Abs) 0.0 0.0 - 0.1 x10E3/uL  Comprehensive metabolic panel  Result Value Ref Range   Glucose 77 65 - 99 mg/dL   BUN 8 6 - 20 mg/dL   Creatinine, Ser 9.140.77 0.57 - 1.00 mg/dL   GFR calc non Af Amer 107 >59 mL/min/1.73   GFR calc Af Amer 123 >59 mL/min/1.73   BUN/Creatinine Ratio 10 9 - 23   Sodium 140 134 - 144 mmol/L   Potassium 4.3 3.5 - 5.2 mmol/L   Chloride 102 96 - 106 mmol/L   CO2 21 20 - 29 mmol/L   Calcium 9.4 8.7 - 10.2 mg/dL   Total Protein 7.4 6.0 - 8.5 g/dL   Albumin 4.1 3.5 - 5.5 g/dL   Globulin, Total 3.3 1.5 - 4.5 g/dL   Albumin/Globulin Ratio 1.2 1.2 - 2.2   Bilirubin Total 0.3 0.0 - 1.2 mg/dL   Alkaline Phosphatase 55 39 - 117 IU/L   AST 15 0 - 40 IU/L   ALT 19 0 - 32 IU/L  Lipid Panel w/o Chol/HDL Ratio  Result Value Ref Range   Cholesterol, Total 182 100 - 199 mg/dL   Triglycerides 782159 (H) 0 - 149 mg/dL   HDL 82 >95>39 mg/dL   VLDL Cholesterol Cal 32 5 - 40 mg/dL   LDL Calculated 68 0 - 99 mg/dL  TSH  Result Value Ref Range   TSH 2.030 0.450 - 4.500 uIU/mL  VITAMIN D 25 Hydroxy (Vit-D Deficiency, Fractures)  Result Value Ref Range   Vit D, 25-Hydroxy 27.7 (L) 30.0 - 100.0 ng/mL      Assessment & Plan:   Problem List Items Addressed This Visit    None    Visit Diagnoses    Acute nasopharyngitis    -  Primary   discussed supportive care with OTC Flonase and pseudophed.  OK to use Afrin for no longer than 3 days.  Netti pot may provide relief.     Relevant Orders   Rapid Strep screen([facility])   Sinus headache       Discussed using pseudophed plus Tylenol.  RTC if no improvement after 1 week       Follow up plan: Return if symptoms worsen or fail to improve.

## 2018-10-01 LAB — CULTURE, GROUP A STREP

## 2018-10-01 LAB — RAPID STREP SCREEN (MED CTR MEBANE ONLY): Strep Gp A Ag, IA W/Reflex: NEGATIVE

## 2018-10-02 ENCOUNTER — Telehealth: Payer: Self-pay | Admitting: Family Medicine

## 2018-10-02 MED ORDER — AMOXICILLIN 875 MG PO TABS: 875.0000 mg | ORAL_TABLET | Freq: Two times a day (BID) | ORAL | 0 refills | Status: DC

## 2018-10-02 NOTE — Telephone Encounter (Signed)
Please let her know that her strep culture did grow out group B strep. I've sent an antibiotic to her pharmacy.

## 2018-10-02 NOTE — Telephone Encounter (Signed)
Detailed message left for patient after DPR was checked.

## 2018-11-10 DIAGNOSIS — J101 Influenza due to other identified influenza virus with other respiratory manifestations: Secondary | ICD-10-CM | POA: Diagnosis not present

## 2018-11-10 DIAGNOSIS — R509 Fever, unspecified: Secondary | ICD-10-CM | POA: Diagnosis not present

## 2019-01-24 DIAGNOSIS — Z20828 Contact with and (suspected) exposure to other viral communicable diseases: Secondary | ICD-10-CM | POA: Diagnosis not present

## 2019-01-24 DIAGNOSIS — R509 Fever, unspecified: Secondary | ICD-10-CM | POA: Diagnosis not present

## 2019-03-26 DIAGNOSIS — R300 Dysuria: Secondary | ICD-10-CM | POA: Diagnosis not present

## 2020-02-16 ENCOUNTER — Ambulatory Visit: Payer: BLUE CROSS/BLUE SHIELD

## 2020-02-19 ENCOUNTER — Ambulatory Visit: Payer: Self-pay | Attending: Internal Medicine

## 2020-02-19 DIAGNOSIS — Z23 Encounter for immunization: Secondary | ICD-10-CM

## 2020-02-19 NOTE — Progress Notes (Signed)
   Covid-19 Vaccination Clinic  Name:  Tina Dixon    MRN: [id number] DOB: [birth year]  02/19/2020  [name] was observed post Covid-19 immunization for 15 minutes without incident. She was provided with Vaccine Information Sheet and instruction to access the V-Safe system.   [name] was instructed to call 911 with any severe reactions post vaccine: Marland Kitchen Difficulty breathing  . Swelling of face and throat  . A fast heartbeat  . A bad rash all over body  . Dizziness and weakness   Immunizations Administered    Name Date Dose VIS Date Route   Pfizer COVID-19 Vaccine 02/19/2020 10:00 AM 0.3 mL 10/24/2019 Intramuscular   Manufacturer: [employer], [employer]   Lot: [id number]   NDC: [address]-5

## 2020-03-17 ENCOUNTER — Ambulatory Visit: Payer: Self-pay | Attending: Internal Medicine

## 2020-03-17 DIAGNOSIS — Z23 Encounter for immunization: Secondary | ICD-10-CM

## 2020-03-17 NOTE — Progress Notes (Signed)
   Covid-19 Vaccination Clinic  Name:  Tina Dixon    MRN: [id number] DOB: [birth year]  03/17/2020  [name] was observed post Covid-19 immunization for 15 minutes without incident. She was provided with Vaccine Information Sheet and instruction to access the V-Safe system.   [name] was instructed to call 911 with any severe reactions post vaccine: Marland Kitchen Difficulty breathing  . Swelling of face and throat  . A fast heartbeat  . A bad rash all over body  . Dizziness and weakness   Immunizations Administered    Name Date Dose VIS Date Route   Pfizer COVID-19 Vaccine 03/17/2020  8:19 AM 0.3 mL 01/07/2019 Intramuscular   Manufacturer: [employer], [employer]   Lot: [id number]   NDC: [address]-4

## 2021-04-20 DIAGNOSIS — Z13220 Encounter for screening for lipoid disorders: Secondary | ICD-10-CM | POA: Diagnosis not present

## 2021-04-20 DIAGNOSIS — Z131 Encounter for screening for diabetes mellitus: Secondary | ICD-10-CM | POA: Diagnosis not present

## 2021-04-20 DIAGNOSIS — E669 Obesity, unspecified: Secondary | ICD-10-CM | POA: Diagnosis not present

## 2021-04-20 DIAGNOSIS — N926 Irregular menstruation, unspecified: Secondary | ICD-10-CM | POA: Diagnosis not present

## 2021-04-20 DIAGNOSIS — Z0000 Encounter for general adult medical examination without abnormal findings: Secondary | ICD-10-CM | POA: Diagnosis not present

## 2021-04-20 DIAGNOSIS — F419 Anxiety disorder, unspecified: Secondary | ICD-10-CM | POA: Diagnosis not present

## 2021-05-25 DIAGNOSIS — F419 Anxiety disorder, unspecified: Secondary | ICD-10-CM | POA: Diagnosis not present

## 2021-08-10 ENCOUNTER — Encounter: Payer: Self-pay | Admitting: Unknown Physician Specialty

## 2021-08-11 DIAGNOSIS — Z3169 Encounter for other general counseling and advice on procreation: Secondary | ICD-10-CM | POA: Diagnosis not present

## 2021-09-05 DIAGNOSIS — N912 Amenorrhea, unspecified: Secondary | ICD-10-CM | POA: Diagnosis not present

## 2021-09-26 ENCOUNTER — Ambulatory Visit (INDEPENDENT_AMBULATORY_CARE_PROVIDER_SITE_OTHER): Payer: BC Managed Care – PPO

## 2021-09-26 ENCOUNTER — Other Ambulatory Visit: Payer: Self-pay

## 2021-09-26 ENCOUNTER — Ambulatory Visit (INDEPENDENT_AMBULATORY_CARE_PROVIDER_SITE_OTHER): Payer: Self-pay | Admitting: *Deleted

## 2021-09-26 VITALS — BP 138/93 | HR 105 | Wt 228.0 lb

## 2021-09-26 DIAGNOSIS — Z3481 Encounter for supervision of other normal pregnancy, first trimester: Secondary | ICD-10-CM

## 2021-09-26 DIAGNOSIS — Z3401 Encounter for supervision of normal first pregnancy, first trimester: Secondary | ICD-10-CM

## 2021-09-26 DIAGNOSIS — O3680X0 Pregnancy with inconclusive fetal viability, not applicable or unspecified: Secondary | ICD-10-CM

## 2021-09-26 DIAGNOSIS — Z3A09 9 weeks gestation of pregnancy: Secondary | ICD-10-CM

## 2021-09-26 DIAGNOSIS — Z3400 Encounter for supervision of normal first pregnancy, unspecified trimester: Secondary | ICD-10-CM

## 2021-09-26 DIAGNOSIS — O0990 Supervision of high risk pregnancy, unspecified, unspecified trimester: Secondary | ICD-10-CM | POA: Insufficient documentation

## 2021-09-26 MED ORDER — DOXYLAMINE-PYRIDOXINE 10-10 MG PO TBEC: 2.0000 | DELAYED_RELEASE_TABLET | Freq: Every day | ORAL | 5 refills | Status: DC

## 2021-09-26 NOTE — Addendum Note (Signed)
Addended by: Scheryl Marten on: 09/26/2021 02:34 PM   Modules accepted: Orders

## 2021-09-26 NOTE — Progress Notes (Signed)
New OB Intake  I explained I am completing New OB Intake today. We discussed her EDD of 04/27/2022 that is based on LMP of 07/21/2021. Pt is G1/P0. I reviewed her allergies, medications, Medical/Surgical/OB history, and appropriate screenings.  Based on history, this is a/an  pregnancy uncomplicated .   Patient Active Problem List   Diagnosis Date Noted   Fatigue 05/20/2018   Depression, major, single episode, in partial remission (HCC) 05/20/2018   Family planning counseling 02/15/2016    Concerns addressed today  Delivery Plans:  Plans to deliver at [facility].   MyChart/Babyscripts MyChart access verified. I explained pt will have some visits in office and some virtually. Babyscripts instructions given and order placed. Patient verifies receipt of registration text/e-mail. Account successfully created and app downloaded.  Blood Pressure Cuff  Blood pressure cuff given  Anatomy US Explained first scheduled [place] will be around 19 weeks.   Labs Discussed Avelina Laine genetic screening with patient. Would like both Panorama and Horizon drawn at new OB visit. Routine prenatal labs needed.    Placed OB Box on problem list and updated   Patient informed that the ultrasound is considered a limited obstetric ultrasound and is not intended to be a complete ultrasound exam.  Patient also informed that the ultrasound is not being completed with the intent of assessing for fetal or placental anomalies or any pelvic abnormalities. Explained that the purpose of today's ultrasound is to assess for dating and fetal heart rate.  Patient acknowledges the purpose of the exam and the limitations of the study.      First visit review I reviewed new OB appt with pt. I explained she will have a pelvic exam, ob bloodwork with genetic screening, and PAP smear.  Explained that patient will be seen by pregnancy navigator following visit with provider.     Scheryl Marten, RN 09/26/2021  2:03 PM

## 2021-09-29 NOTE — Progress Notes (Signed)
Attestation of Attending Supervision of clinical support staff: I agree with the care provided to this patient and was available for any consultation.  I have reviewed the RN's note and chart. I was available for consult and to see the patient if needed.   Vida Nicol MD MPH Attending Physician Faculty Practice- [facility]  

## 2021-10-11 ENCOUNTER — Other Ambulatory Visit: Payer: Self-pay

## 2021-10-11 NOTE — Progress Notes (Signed)
Rx order sent back to [facility] informing them to run Rx under brand name so that INS WILL COVER  diclegis.

## 2021-10-19 ENCOUNTER — Ambulatory Visit (INDEPENDENT_AMBULATORY_CARE_PROVIDER_SITE_OTHER): Payer: BC Managed Care – PPO | Admitting: Obstetrics & Gynecology

## 2021-10-19 ENCOUNTER — Other Ambulatory Visit (HOSPITAL_COMMUNITY)
Admission: RE | Admit: 2021-10-19 | Discharge: 2021-10-19 | Disposition: A | Discharge status: Home / Self Care | Payer: BC Managed Care – PPO | Source: Ambulatory Visit | Attending: Obstetrics & Gynecology | Admitting: Obstetrics & Gynecology

## 2021-10-19 ENCOUNTER — Other Ambulatory Visit: Payer: Self-pay

## 2021-10-19 ENCOUNTER — Encounter: Payer: Self-pay | Admitting: Obstetrics & Gynecology

## 2021-10-19 DIAGNOSIS — O99210 Obesity complicating pregnancy, unspecified trimester: Secondary | ICD-10-CM | POA: Diagnosis not present

## 2021-10-19 DIAGNOSIS — Z3A12 12 weeks gestation of pregnancy: Secondary | ICD-10-CM | POA: Diagnosis not present

## 2021-10-19 DIAGNOSIS — O10919 Unspecified pre-existing hypertension complicating pregnancy, unspecified trimester: Secondary | ICD-10-CM | POA: Diagnosis not present

## 2021-10-19 DIAGNOSIS — Z23 Encounter for immunization: Secondary | ICD-10-CM

## 2021-10-19 DIAGNOSIS — O0991 Supervision of high risk pregnancy, unspecified, first trimester: Secondary | ICD-10-CM | POA: Diagnosis not present

## 2021-10-19 DIAGNOSIS — F32A Depression, unspecified: Secondary | ICD-10-CM

## 2021-10-19 DIAGNOSIS — O99340 Other mental disorders complicating pregnancy, unspecified trimester: Secondary | ICD-10-CM | POA: Diagnosis not present

## 2021-10-19 DIAGNOSIS — Z3481 Encounter for supervision of other normal pregnancy, first trimester: Secondary | ICD-10-CM | POA: Diagnosis not present

## 2021-10-19 MED ORDER — SERTRALINE HCL 100 MG PO TABS: 100.0000 mg | ORAL_TABLET | Freq: Every day | ORAL | 3 refills | Status: DC

## 2021-10-19 MED ORDER — ASPIRIN EC 81 MG PO TBEC: 81.0000 mg | DELAYED_RELEASE_TABLET | Freq: Every day | ORAL | 2 refills | Status: DC

## 2021-10-19 NOTE — Progress Notes (Signed)
History:   Tina Dixon is a 30 y.o. G1P0 at [redacted]w[redacted]d by LMP consistent with six week ultrasound being seen today for her first obstetrical visit.  Her history is significant for  [facility]; was noted to be hypertensive during her intake visit at [redacted] weeks gestation and today . Also has history of depression, on Zoloft 50 mg daily, but feels she needs increased dosage. Denies HI/SI.  Patient does intend to breast feed.   Patient reports no complaints.      HISTORY: OB History  Gravida Para Term Preterm AB Living  1 0 0 0 0 0  SAB IAB Ectopic Multiple Live Births  0 0 0 0 0    # Outcome Date GA Lbr Len/2nd Weight Sex Delivery Anes PTL Lv  1 Current             Last pap smear was done 2016 and was normal  Past Medical History:  Diagnosis Date   Anxiety    Depression    Past Surgical History:  Procedure Laterality Date   WISDOM TOOTH EXTRACTION     Family History  Problem Relation Age of Onset   Hypertension Mother    Cancer Father        pancreatic   Pancreatitis Father    Diabetes Father    Hypertension Maternal Grandfather    Diabetes Maternal Grandmother    Hypertension Maternal Grandmother    Heart disease Paternal Grandmother    Social History   Tobacco Use   Smoking status: Never   Smokeless tobacco: Never  Vaping Use   Vaping Use: Never used  Substance Use Topics   Alcohol use: No   Drug use: No   No Known Allergies Current Outpatient Medications on File Prior to Visit  Medication Sig Dispense Refill   Doxylamine-Pyridoxine (DICLEGIS) 10-10 MG TBEC Take 2 tablets by mouth at bedtime. If symptoms persist, add one tablet in the morning and one in the afternoon 100 tablet 5   Prenatal Vit-Fe Fumarate-FA (MULTIVITAMIN-PRENATAL) 27-0.8 MG TABS tablet Take 1 tablet by mouth daily at 12 noon.     sertraline (ZOLOFT) 25 MG tablet Take 25 mg by mouth daily.     No current facility-administered medications on file prior to visit.    Review of Systems Pertinent items  noted in HPI and remainder of comprehensive ROS otherwise negative.  Physical Exam:   Vitals:   10/19/21 1057  BP: (!) 144/88  Pulse: (!) 111  Weight: 221 lb (100.2 kg)   Fetal Heart Rate (bpm): 149 on bedside ultrasound  General: well-developed, well-nourished female in no acute distress  Breasts:  normal appearance, no masses or tenderness bilaterally, exam done in the presence of a chaperone.   Skin: normal coloration and turgor, no rashes  Neurologic: oriented, normal, negative, normal mood  Extremities: normal strength, tone, and muscle mass, ROM of all joints is normal  HEENT PERRLA, extraocular movement intact and sclera clear, anicteric  Neck supple and no masses  Cardiovascular: regular rate and rhythm  Respiratory:  no respiratory distress, normal breath sounds  Abdomen: soft, non-tender; bowel sounds normal; no masses,  no organomegaly  Pelvic: normal external genitalia, no lesions, normal vaginal mucosa, normal vaginal discharge, friable cervix with significant bleeding during speculum exam even before pap smear, pap smear done. Bleeding noted to be coming from transformation zone, treated with silver nitrate.  Exam done in the presence of a chaperone.     Assessment:    Pregnancy: G1P0 Patient  Active Problem List   Diagnosis Date Noted   Maternal morbid obesity, antepartum ([place]) 10/19/2021   Depression affecting pregnancy 10/19/2021   Chronic hypertension during pregnancy 10/19/2021   Supervision of high-risk pregnancy 09/26/2021     Plan:    1. Chronic hypertension during pregnancy [ ]  Baseline labs (CBC, CMP, urine Pr:Cr) - ordered [x]  Aspirin 81 mg daily prescribed after 12 weeks [ ]  Serial growth scans 20-24-28-32-35-38 [ ]  Antenatal testing starting at 32 weeks [ ]  Delivery by 39 weeks (with meds, 40 with no meds) or earlier if needed Discussed implications of CHTN in pregnancy, need for antenatal testing and frequent ultrasounds/prenatal visits, need for  optimizing BP control to decrease CHTN/preeclampsia associated maternal-fetal morbidity and mortality. Will check baseline labs today.  2. Depression affecting pregnancy Increased Zoloft to 100 mg daily, will monitor effect. Patient consented to [facility] services about presenting concerns and psychiatric consultation as appropriate. - Ambulatory referral to [place] - sertraline (ZOLOFT) 100 MG tablet; Take 1 tablet (100 mg total) by mouth daily.  Dispense: 30 tablet; Refill: 3  3. Maternal morbid obesity, antepartum (HCC) Recommended TWG 11-20 lbs. Baseline labs ordered. Low dose ASA prescribed. - Comprehensive metabolic panel - Hemoglobin A1c - Protein / creatinine ratio, urine - TSH - aspirin EC 81 MG tablet; Take 1 tablet (81 mg total) by mouth daily. Take after 12 weeks for prevention of preeclampsia later in pregnancy  Dispense: 300 tablet; Refill: 2 - [place] MFM OB DETAIL +14 WK; Future  4. [redacted] weeks gestation of pregnancy 5. Supervision of high risk pregnancy in first trimester - Cytology - PAP - CBC/D/Plt+RPR+Rh+ABO+RubIgG... - Culture, OB Urine - Genetic Screening - Ambulatory referral to Integrated Behavioral Health - [place] MFM OB DETAIL +14 WK; Future - Flu vaccine Initial labs drawn. Continue prenatal vitamins. Problem list reviewed and updated. Genetic Screening discussed, NIPS: ordered. Ultrasound discussed; fetal anatomic survey: ordered. Anticipatory guidance about prenatal visits given including labs, ultrasounds, and testing. Discussed usage of Babyscripts and virtual visits as additional source of managing and completing prenatal visits in midst of coronavirus and pandemic.   Encouraged to complete MyChart Registration for her ability to review results, send requests, and have questions addressed.  The nature of [place] for [facility]/Faculty Practice with multiple MDs and Advanced Practice Providers was  explained to patient; also emphasized that residents, students are part of our team. Routine obstetric precautions reviewed. Encouraged to seek out care at office or emergency room [facility] preferred) for urgent and/or emergent concerns. Return in about 4 weeks (around 11/16/2021) for OFFICE OB VISIT (MD only).     Verita Schneiders, MD, [place] for [employer], [place]

## 2021-10-20 LAB — CBC/D/PLT+RPR+RH+ABO+RUBIGG...
Antibody Screen: NEGATIVE
Basophils Absolute: 0.1 10*3/uL (ref 0.0–0.2)
Basos: 1 %
EOS (ABSOLUTE): 0.1 10*3/uL (ref 0.0–0.4)
Eos: 1 %
HCV Ab: 0.2 s/co ratio (ref 0.0–0.9)
HIV Screen 4th Generation wRfx: NONREACTIVE
Hematocrit: 40.6 % (ref 34.0–46.6)
Hemoglobin: 13.5 g/dL (ref 11.1–15.9)
Hepatitis B Surface Ag: NEGATIVE
Immature Grans (Abs): 0 10*3/uL (ref 0.0–0.1)
Immature Granulocytes: 0 %
Lymphocytes Absolute: 2.2 10*3/uL (ref 0.7–3.1)
Lymphs: 21 %
MCH: 30.3 pg (ref 26.6–33.0)
MCHC: 33.3 g/dL (ref 31.5–35.7)
MCV: 91 fL (ref 79–97)
Monocytes Absolute: 0.9 10*3/uL (ref 0.1–0.9)
Monocytes: 9 %
Neutrophils Absolute: 7.1 10*3/uL — ABNORMAL HIGH (ref 1.4–7.0)
Neutrophils: 68 %
Platelets: 396 10*3/uL (ref 150–450)
RBC: 4.46 x10E6/uL (ref 3.77–5.28)
RDW: 11.6 % — ABNORMAL LOW (ref 11.7–15.4)
RPR Ser Ql: NONREACTIVE
Rh Factor: POSITIVE
Rubella Antibodies, IGG: 1.08 index (ref >0.99)
WBC: 10.3 10*3/uL (ref 3.4–10.8)

## 2021-10-20 LAB — COMPREHENSIVE METABOLIC PANEL
ALT: 14 IU/L (ref 0–32)
AST: 11 IU/L (ref 0–40)
Albumin/Globulin Ratio: 1.3 (ref 1.2–2.2)
Albumin: 3.9 g/dL (ref 3.9–5.0)
Alkaline Phosphatase: 66 IU/L (ref 44–121)
BUN/Creatinine Ratio: 13 (ref 9–23)
BUN: 8 mg/dL (ref 6–20)
Bilirubin Total: 0.3 mg/dL (ref 0.0–1.2)
CO2: 19 mmol/L — ABNORMAL LOW (ref 20–29)
Calcium: 9.3 mg/dL (ref 8.7–10.2)
Chloride: 101 mmol/L (ref 96–106)
Creatinine, Ser: 0.63 mg/dL (ref 0.57–1.00)
Globulin, Total: 3 g/dL (ref 1.5–4.5)
Glucose: 95 mg/dL (ref 70–99)
Potassium: 4 mmol/L (ref 3.5–5.2)
Sodium: 135 mmol/L (ref 134–144)
Total Protein: 6.9 g/dL (ref 6.0–8.5)
eGFR: 123 mL/min/{1.73_m2} (ref >59)

## 2021-10-20 LAB — CYTOLOGY - PAP
Chlamydia: NEGATIVE
Comment: NEGATIVE
Comment: NEGATIVE
Comment: NEGATIVE
Comment: NORMAL
Diagnosis: NEGATIVE
High risk HPV: NEGATIVE
Neisseria Gonorrhea: NEGATIVE
Trichomonas: NEGATIVE

## 2021-10-20 LAB — PROTEIN / CREATININE RATIO, URINE
Creatinine, Urine: 198 mg/dL
Protein, Ur: 16.9 mg/dL
Protein/Creat Ratio: 85 mg/g creat (ref 0–200)

## 2021-10-20 LAB — HCV INTERPRETATION

## 2021-10-20 LAB — TSH: TSH: 1.24 u[IU]/mL (ref 0.450–4.500)

## 2021-10-20 LAB — HEMOGLOBIN A1C
Est. average glucose Bld gHb Est-mCnc: 108 mg/dL
Hgb A1c MFr Bld: 5.4 % (ref 4.8–5.6)

## 2021-10-21 LAB — URINE CULTURE, OB REFLEX

## 2021-10-21 LAB — CULTURE, OB URINE

## 2021-10-24 ENCOUNTER — Ambulatory Visit (INDEPENDENT_AMBULATORY_CARE_PROVIDER_SITE_OTHER): Payer: BC Managed Care – PPO | Admitting: Licensed Clinical Social Worker

## 2021-10-24 DIAGNOSIS — F32A Depression, unspecified: Secondary | ICD-10-CM | POA: Diagnosis not present

## 2021-10-24 DIAGNOSIS — O99340 Other mental disorders complicating pregnancy, unspecified trimester: Secondary | ICD-10-CM

## 2021-10-25 NOTE — BH Specialist Note (Signed)
Integrated Behavioral Health via Telemedicine Visit  10/25/2021 Tina Dixon [id number]  Number of Integrated Behavioral Health visits: 1 Session Start time: 10am  Session End time: 10:38am Total time: 38 mins via mychart video   Referring Provider: Dr. Macon Large  Patient/Family location: Home  [facility] Provider location: [facility]  All persons participating in visit: Pt Tina Dixon and LCSW A. Wilberta Dorvil  Types of Service: Individual psychotherapy  I connected with Martha Clan and/or Bethann Berkshire Peer's n/a via  Telephone or [employer]  (Video is Caregility application) and verified that I am speaking with the correct person using two identifiers. Discussed confidentiality: Yes   I discussed the limitations of telemedicine and the availability of in person appointments.  Discussed there is a possibility of technology failure and discussed alternative modes of communication if that failure occurs.  I discussed that engaging in this telemedicine visit, they consent to the provision of behavioral healthcare and the services will be billed under their insurance.  Patient and/or legal guardian expressed understanding and consented to Telemedicine visit: Yes   Presenting Concerns: Patient and/or family reports the following symptoms/concerns: depression affecting pregnancy Duration of problem: approx 8 months ; Severity of problem: mild  Patient and/or Family's Strengths/Protective Factors: Concrete supports in place (healthy food, safe environments, etc.)  Goals Addressed: Patient will:  Reduce symptoms of: depression   Increase knowledge and/or ability of: coping skills   Demonstrate ability to: Increase healthy adjustment to current life circumstances  Progress towards Goals: Ongoing  Interventions: Interventions utilized:  Supportive Counseling Standardized Assessments completed: PHQ 9  Assessment: Patient currently experiencing depression affecting pregnancy.    Patient may benefit from integrated behavioral health.  Plan: Follow up with behavioral health clinician on : 3 weeks via mychart  Behavioral recommendations: redirect negative thoughts, prioritize rest, engage in self care, light activity to boost mood and prevent burnout, communicate needs with spouse and limit unproductive contact  Referral(s): Integrated [employer] (In Clinic)  I discussed the assessment and treatment plan with the patient and/or parent/guardian. They were provided an opportunity to ask questions and all were answered. They agreed with the plan and demonstrated an understanding of the instructions.   They were advised to call back or seek an in-person evaluation if the symptoms worsen or if the condition fails to improve as anticipated.  Gwyndolyn Saxon, LCSW

## 2021-10-27 ENCOUNTER — Encounter: Payer: Self-pay | Admitting: Radiology

## 2021-10-27 ENCOUNTER — Telehealth: Payer: Self-pay | Admitting: Radiology

## 2021-10-27 NOTE — Telephone Encounter (Signed)
Patient was informed of Panorama results and fetal sex

## 2021-11-13 NOTE — L&D Delivery Note (Addendum)
OB/GYN Faculty Practice Delivery Note  Tina Dixon is a 31 y.o. G1P0 s/p SVD at [redacted]w[redacted]d. She was admitted for IOL for severe FGR, cHTN, and A2GDM  ROM: 5h 21m with clear fluid GBS Status: positive Maximum Maternal Temperature: 98.5  Labor Progress: Presented for IOL, received a cooks balloon and pitocin. She was ultimately AROMed and with uptitration of pitocin progressed to complete.  Delivery Date/Time: 2336 on 5/26 Delivery: Called to room and patient was complete and pushing. Head delivered LOA. Nuchal cord x1 present and delivered through. Shoulder and body delivered in usual fashion. Infant with spontaneous cry, placed on mother's abdomen, dried and stimulated. Cord clamped x 2 after 1-minute delay, and cut by father of baby under my direct supervision. Cord blood drawn. Placenta delivered spontaneously with gentle cord traction. Fundus firm with massage and Pitocin. Labia, perineum, vagina, and cervix inspected and found to have a second degree laceration that was repaired with 3-0 vicryl in usual fashion. She also had a small right periurethral laceration that was repaired with one figure of eight stitch with 4-0 monocryl.   Placenta: intact, 3V cord, to L&D Complications: none Lacerations: second degree and right periurethral EBL: 150cc Analgesia: epidural  Infant: female  APGARs 8,9  weight pending  Warner Mccreedy, MD, MPH OB Fellow, Faculty Practice Center for [employer], [facility]

## 2021-11-15 ENCOUNTER — Other Ambulatory Visit: Payer: Self-pay | Admitting: *Deleted

## 2021-11-15 ENCOUNTER — Other Ambulatory Visit: Payer: Self-pay

## 2021-11-15 ENCOUNTER — Ambulatory Visit (INDEPENDENT_AMBULATORY_CARE_PROVIDER_SITE_OTHER): Payer: BC Managed Care – PPO | Admitting: Obstetrics and Gynecology

## 2021-11-15 ENCOUNTER — Encounter: Payer: Self-pay | Admitting: Obstetrics and Gynecology

## 2021-11-15 VITALS — BP 131/81 | HR 90 | Wt 215.0 lb

## 2021-11-15 DIAGNOSIS — O0992 Supervision of high risk pregnancy, unspecified, second trimester: Secondary | ICD-10-CM

## 2021-11-15 DIAGNOSIS — O10919 Unspecified pre-existing hypertension complicating pregnancy, unspecified trimester: Secondary | ICD-10-CM

## 2021-11-15 DIAGNOSIS — O99340 Other mental disorders complicating pregnancy, unspecified trimester: Secondary | ICD-10-CM

## 2021-11-15 DIAGNOSIS — R634 Abnormal weight loss: Secondary | ICD-10-CM

## 2021-11-15 DIAGNOSIS — K219 Gastro-esophageal reflux disease without esophagitis: Secondary | ICD-10-CM

## 2021-11-15 DIAGNOSIS — O99210 Obesity complicating pregnancy, unspecified trimester: Secondary | ICD-10-CM

## 2021-11-15 DIAGNOSIS — F32A Depression, unspecified: Secondary | ICD-10-CM

## 2021-11-15 DIAGNOSIS — Z6838 Body mass index (BMI) 38.0-38.9, adult: Secondary | ICD-10-CM

## 2021-11-15 MED ORDER — DOXYLAMINE-PYRIDOXINE 10-10 MG PO TBEC
2.0000 | DELAYED_RELEASE_TABLET | Freq: Every day | ORAL | 5 refills | Status: DC
Start: 2021-11-15 — End: 2021-12-13

## 2021-11-15 MED ORDER — PANTOPRAZOLE SODIUM 40 MG PO TBEC: 40.0000 mg | DELAYED_RELEASE_TABLET | Freq: Every day | ORAL | 2 refills | Status: DC

## 2021-11-15 MED ORDER — PROMETHAZINE HCL 25 MG PO TABS: 25.0000 mg | ORAL_TABLET | Freq: Four times a day (QID) | ORAL | 2 refills | Status: DC | PRN

## 2021-11-15 NOTE — Progress Notes (Signed)
° °  PRENATAL VISIT NOTE  Subjective:  Tina Dixon is a 31 y.o. G1P0 at [redacted]w[redacted]d being seen today for ongoing prenatal care.  She is currently monitored for the following issues for this high-risk pregnancy and has Supervision of high-risk pregnancy; Maternal morbid obesity, antepartum (HCC); Depression affecting pregnancy; and Chronic hypertension during pregnancy on their problem list.  Patient reports  continued n/v .  Contractions: Not present. Vag. Bleeding: None.  Movement: Present. Denies leaking of fluid.   The following portions of the patient's history were reviewed and updated as appropriate: allergies, current medications, past family history, past medical history, past social history, past surgical history and problem list.   Objective:   Vitals:   11/15/21 0952  BP: 131/81  Pulse: 90  Weight: 215 lb (97.5 kg)    Fetal Status: Fetal Heart Rate (bpm): 148   Movement: Present     General:  Alert, oriented and cooperative. Patient is in no acute distress.  Skin: Skin is warm and dry. No rash noted.   Cardiovascular: Normal heart rate noted  Respiratory: Normal respiratory effort, no problems with respiration noted  Abdomen: Soft, gravid, appropriate for gestational age.  Pain/Pressure: Absent     Pelvic: Cervical exam deferred        Extremities: Normal range of motion.  Edema: None  Mental Status: Normal mood and affect. Normal behavior. Normal judgment and thought content.   Assessment and Plan:  Pregnancy: G1P0 at [redacted]w[redacted]d 1. Supervision of high risk pregnancy in second trimester Has anatomy u/s already scheduled - AFP, Serum, Open Spina Bifida  2. Chronic hypertension during pregnancy Doing well on no meds. Continue to watch closely - AFP, Serum, Open Spina Bifida  3. Maternal morbid obesity, antepartum (HCC) - AFP, Serum, Open Spina Bifida  4. BMI 38.0-38.9,adult - AFP, Serum, Open Spina Bifida  5. Weight loss Pt was doing okay on diclegis but then ran out and  had issues with pharmacy refilling it. Refill sent in pt told to let us know of any issues with the Rx. She also has GERD s/s and protonix was sent in for her. Pt has scale at home and to monitor at home and let us know if weight continues to drop - AFP, Serum, Open Spina Bifida  6. Depression affecting pregnancy Doing well on zoloft  7. Gastroesophageal reflux disease, unspecified whether esophagitis present  Preterm labor symptoms and general obstetric precautions including but not limited to vaginal bleeding, contractions, leaking of fluid and fetal movement were reviewed in detail with the patient. Please refer to After Visit Summary for other counseling recommendations.   Return in about 3 weeks (around 12/06/2021) for in person, md or app, low risk ob.  Future Appointments  Date Time Provider Department Center  11/29/2021  1:00 PM [facility]    Edroy Bing, MD

## 2021-11-15 NOTE — Progress Notes (Signed)
Insurance will not cover Diclegis, will send in phenergan instead.

## 2021-11-15 NOTE — Progress Notes (Signed)
ROB [redacted]w[redacted]d  CC: Nausea and Vomiting has not improved pt has been taking Vit B6 would like to try Rx management.  Pt has lost weight due to N&V

## 2021-11-16 LAB — AFP, SERUM, OPEN SPINA BIFIDA
AFP MoM: 1.1
AFP Value: 30.9 ng/mL
Gest. Age on Collection Date: 16 weeks
Maternal Age At EDD: 30.3 yr
OSBR Risk 1 IN: 8933
Test Results:: NEGATIVE
Weight: 215 [lb_av]

## 2021-11-23 ENCOUNTER — Encounter: Payer: Self-pay | Admitting: Obstetrics and Gynecology

## 2021-11-24 ENCOUNTER — Encounter (HOSPITAL_COMMUNITY): Payer: Self-pay | Admitting: Obstetrics & Gynecology

## 2021-11-24 ENCOUNTER — Inpatient Hospital Stay (HOSPITAL_COMMUNITY)
Admission: AD | Admit: 2021-11-24 | Discharge: 2021-11-24 | Disposition: A | Discharge status: Home / Self Care | Payer: BC Managed Care – PPO | Attending: Obstetrics & Gynecology | Admitting: Obstetrics & Gynecology

## 2021-11-24 DIAGNOSIS — Z3A18 18 weeks gestation of pregnancy: Secondary | ICD-10-CM

## 2021-11-24 DIAGNOSIS — O10919 Unspecified pre-existing hypertension complicating pregnancy, unspecified trimester: Secondary | ICD-10-CM

## 2021-11-24 DIAGNOSIS — B348 Other viral infections of unspecified site: Secondary | ICD-10-CM | POA: Diagnosis not present

## 2021-11-24 DIAGNOSIS — R112 Nausea with vomiting, unspecified: Secondary | ICD-10-CM | POA: Diagnosis not present

## 2021-11-24 DIAGNOSIS — Z20822 Contact with and (suspected) exposure to covid-19: Secondary | ICD-10-CM | POA: Insufficient documentation

## 2021-11-24 DIAGNOSIS — O162 Unspecified maternal hypertension, second trimester: Secondary | ICD-10-CM | POA: Diagnosis not present

## 2021-11-24 DIAGNOSIS — O0992 Supervision of high risk pregnancy, unspecified, second trimester: Secondary | ICD-10-CM

## 2021-11-24 DIAGNOSIS — O26892 Other specified pregnancy related conditions, second trimester: Secondary | ICD-10-CM | POA: Diagnosis not present

## 2021-11-24 DIAGNOSIS — O219 Vomiting of pregnancy, unspecified: Secondary | ICD-10-CM | POA: Diagnosis not present

## 2021-11-24 DIAGNOSIS — O99210 Obesity complicating pregnancy, unspecified trimester: Secondary | ICD-10-CM

## 2021-11-24 LAB — COMPREHENSIVE METABOLIC PANEL
ALT: 12 U/L (ref 0–44)
AST: 12 U/L — ABNORMAL LOW (ref 15–41)
Albumin: 2.8 g/dL — ABNORMAL LOW (ref 3.5–5.0)
Alkaline Phosphatase: 80 U/L (ref 38–126)
Anion gap: 10 (ref 5–15)
BUN: <5 mg/dL — ABNORMAL LOW (ref 6–20)
CO2: 22 mmol/L (ref 22–32)
Calcium: 8.6 mg/dL — ABNORMAL LOW (ref 8.9–10.3)
Chloride: 103 mmol/L (ref 98–111)
Creatinine, Ser: 0.58 mg/dL (ref 0.44–1.00)
GFR, Estimated: >60 mL/min (ref >60)
Glucose, Bld: 114 mg/dL — ABNORMAL HIGH (ref 70–99)
Potassium: 3.7 mmol/L (ref 3.5–5.1)
Sodium: 135 mmol/L (ref 135–145)
Total Bilirubin: 0.5 mg/dL (ref 0.3–1.2)
Total Protein: 7 g/dL (ref 6.5–8.1)

## 2021-11-24 LAB — URINALYSIS, ROUTINE W REFLEX MICROSCOPIC
Bilirubin Urine: NEGATIVE
Glucose, UA: NEGATIVE mg/dL
Ketones, ur: 15 mg/dL — AB
Nitrite: NEGATIVE
Protein, ur: NEGATIVE mg/dL
Specific Gravity, Urine: 1.01 (ref 1.005–1.030)
pH: 6 (ref 5.0–8.0)

## 2021-11-24 LAB — URINALYSIS, MICROSCOPIC (REFLEX)

## 2021-11-24 LAB — RESP PANEL BY RT-PCR (FLU A&B, COVID) ARPGX2
Influenza A by PCR: NEGATIVE
Influenza B by PCR: NEGATIVE
SARS Coronavirus 2 by RT PCR: NEGATIVE

## 2021-11-24 MED ORDER — ONDANSETRON HCL 4 MG/2ML IJ SOLN
4.0000 mg | Freq: Once | INTRAMUSCULAR | Status: AC
  Administered 2021-11-24: 4 mg via INTRAVENOUS
  Filled 2021-11-24: qty 2

## 2021-11-24 MED ORDER — ONDANSETRON 4 MG PO TBDP: 4.0000 mg | ORAL_TABLET | Freq: Three times a day (TID) | ORAL | 1 refills | Status: DC | PRN

## 2021-11-24 MED ORDER — LACTATED RINGERS IV BOLUS
1000.0000 mL | Freq: Once | INTRAVENOUS | Status: AC
  Administered 2021-11-24: 1000 mL via INTRAVENOUS

## 2021-11-24 NOTE — MAU Provider Note (Signed)
History     [id number]  Arrival date and time: 11/24/21 1132    Chief Complaint  Patient presents with   Emesis   Nausea     HPI Tina Dixon is a 31 y.o. at 7828w0d by 9wk US with PMHx notable for cHTN, who presents for nausea, vomiting, and dehydration.   Patient reports she has been feeling unwell for a few days She endorses runny nose, sore throat, cough, nausea, vomiting Endorses some fetal movement No vaginal bleeding or gush of fluid No abdominal pain No burning or pain with urination No vaginal discharge   A/Positive/-- (12/07 1130)  OB History     Gravida  1   Para      Term      Preterm      AB      Living         SAB      IAB      Ectopic      Multiple      Live Births              Past Medical History:  Diagnosis Date   Anxiety    Depression     Past Surgical History:  Procedure Laterality Date   WISDOM TOOTH EXTRACTION      Family History  Problem Relation Age of Onset   Hypertension Mother    Cancer Father        pancreatic   Pancreatitis Father    Diabetes Father    Hypertension Maternal Grandfather    Diabetes Maternal Grandmother    Hypertension Maternal Grandmother    Heart disease Paternal Grandmother     Social History   Socioeconomic History   Marital status: Married    Spouse name: Not on file   Number of children: Not on file   Years of education: Not on file   Highest education level: Not on file  Occupational History   Not on file  Tobacco Use   Smoking status: Never   Smokeless tobacco: Never  Vaping Use   Vaping Use: Never used  Substance and Sexual Activity   Alcohol use: No   Drug use: No   Sexual activity: Yes    Birth control/protection: None  Other Topics Concern   Not on file  Social History Narrative   ** Merged History Encounter **       Social Determinants of Health   Financial Resource Strain: Not on file  Food Insecurity: Not on file  Transportation Needs: Not on file   Physical Activity: Not on file  Stress: Not on file  Social Connections: Not on file  Intimate Partner Violence: Not on file    No Known Allergies  No current facility-administered medications on file prior to encounter.   Current Outpatient Medications on File Prior to Encounter  Medication Sig Dispense Refill   aspirin EC 81 MG tablet Take 1 tablet (81 mg total) by mouth daily. Take after 12 weeks for prevention of preeclampsia later in pregnancy 300 tablet 2   pantoprazole (PROTONIX) 40 MG tablet Take 1 tablet (40 mg total) by mouth daily. 60 tablet 2   Prenatal Vit-Fe Fumarate-FA (MULTIVITAMIN-PRENATAL) 27-0.8 MG TABS tablet Take 1 tablet by mouth daily at 12 noon.     promethazine (PHENERGAN) 25 MG tablet Take 1 tablet (25 mg total) by mouth every 6 (six) hours as needed for nausea or vomiting. 30 tablet 2   sertraline (ZOLOFT) 100 MG tablet Take 1 tablet (  100 mg total) by mouth daily. 30 tablet 3   Doxylamine-Pyridoxine (DICLEGIS) 10-10 MG TBEC Take 2 tablets by mouth at bedtime. If symptoms persist, add one tablet in the morning and one in the afternoon 100 tablet 5     ROS Pertinent positives and negative per HPI, all others reviewed and negative  Physical Exam   BP 123/78    Pulse 98    Temp 98.2 F (36.8 C)    Resp 18    LMP 07/21/2021   Patient Vitals for the past 24 hrs:  BP Temp Pulse Resp  11/24/21 1438 123/78 -- 98 --  11/24/21 1225 (!) 142/78 98.2 F (36.8 C) (!) 114 18    Physical Exam Vitals reviewed.  Constitutional:      General: She is not in acute distress.    Appearance: She is well-developed. She is not diaphoretic.  Eyes:     General: No scleral icterus. Pulmonary:     Effort: Pulmonary effort is normal. No respiratory distress.  Abdominal:     General: There is no distension.     Palpations: Abdomen is soft.     Tenderness: There is no abdominal tenderness. There is no guarding or rebound.  Skin:    General: Skin is warm and dry.   Neurological:     Mental Status: She is alert.     Coordination: Coordination normal.     Cervical Exam    Bedside Ultrasound Not done  My interpretation: n/a  FHT 155 bpm by doppler  Labs Results for orders placed or performed during the hospital encounter of 11/24/21 (from the past 24 hour(s))  Urinalysis, Routine w reflex microscopic Urine, Clean Catch     Status: Abnormal   Collection Time: 11/24/21 12:30 PM  Result Value Ref Range   Color, Urine YELLOW YELLOW   APPearance CLEAR CLEAR   Specific Gravity, Urine 1.010 1.005 - 1.030   pH 6.0 5.0 - 8.0   Glucose, UA NEGATIVE NEGATIVE mg/dL   Hgb urine dipstick TRACE (A) NEGATIVE   Bilirubin Urine NEGATIVE NEGATIVE   Ketones, ur 15 (A) NEGATIVE mg/dL   Protein, ur NEGATIVE NEGATIVE mg/dL   Nitrite NEGATIVE NEGATIVE   Leukocytes,Ua MODERATE (A) NEGATIVE  Urinalysis, Microscopic (reflex)     Status: Abnormal   Collection Time: 11/24/21 12:30 PM  Result Value Ref Range   RBC / HPF 0-5 0 - 5 RBC/hpf   WBC, UA 6-10 0 - 5 WBC/hpf   Bacteria, UA RARE (A) NONE SEEN   Squamous Epithelial / LPF 0-5 0 - 5  Resp Panel by RT-PCR (Flu A&B, Covid) Nasopharyngeal Swab     Status: None   Collection Time: 11/24/21 12:47 PM   Specimen: Nasopharyngeal Swab; Nasopharyngeal(NP) swabs in vial transport medium  Result Value Ref Range   SARS Coronavirus 2 by RT PCR NEGATIVE NEGATIVE   Influenza A by PCR NEGATIVE NEGATIVE   Influenza B by PCR NEGATIVE NEGATIVE  Comprehensive metabolic panel     Status: Abnormal   Collection Time: 11/24/21 12:47 PM  Result Value Ref Range   Sodium 135 135 - 145 mmol/L   Potassium 3.7 3.5 - 5.1 mmol/L   Chloride 103 98 - 111 mmol/L   CO2 22 22 - 32 mmol/L   Glucose, Bld 114 (H) 70 - 99 mg/dL   BUN <5 (L) 6 - 20 mg/dL   Creatinine, Ser 0.01 0.44 - 1.00 mg/dL   Calcium 8.6 (L) 8.9 - 10.3 mg/dL   Total  Protein 7.0 6.5 - 8.1 g/dL   Albumin 2.8 (L) 3.5 - 5.0 g/dL   AST 12 (L) 15 - 41 U/L   ALT 12 0 - 44  U/L   Alkaline Phosphatase 80 38 - 126 U/L   Total Bilirubin 0.5 0.3 - 1.2 mg/dL   GFR, Estimated >42 >70 mL/min   Anion gap 10 5 - 15    Imaging No results found.  MAU Course  Procedures Lab Orders         Resp Panel by RT-PCR (Flu A&B, Covid) Nasopharyngeal Swab         Urinalysis, Routine w reflex microscopic Urine, Clean Catch         Comprehensive metabolic panel         Urinalysis, Microscopic (reflex)    Meds ordered this encounter  Medications   lactated ringers bolus 1,000 mL   ondansetron (ZOFRAN) injection 4 mg   ondansetron (ZOFRAN-ODT) 4 MG disintegrating tablet    Sig: Take 1 tablet (4 mg total) by mouth every 8 (eight) hours as needed for nausea or vomiting.    Dispense:  30 tablet    Refill:  1   Imaging Orders  No imaging studies ordered today    MDM moderate  Assessment and Plan  #Nausea and vomiting of pregnancy #Viral illness #[redacted] weeks gestation of pregnancy Patient with symptoms of viral illness. COVID and flu negative. CMP without major metabolic abnormality. Feeling much better after fluids and anti-emetics, able to tolerate PO challenge. D/c home.   #cHTN Initial BP mild range, subsequently normal range, no change in management.   #FWB Normal fetal heart tones   Dispo: discharged to home in stable condition.   Venora Maples, MD/MPH 11/24/21 3:33 PM  Allergies as of 11/24/2021   No Known Allergies      Medication List     TAKE these medications    aspirin EC 81 MG tablet Take 1 tablet (81 mg total) by mouth daily. Take after 12 weeks for prevention of preeclampsia later in pregnancy   Doxylamine-Pyridoxine 10-10 MG Tbec Commonly known as: Diclegis Take 2 tablets by mouth at bedtime. If symptoms persist, add one tablet in the morning and one in the afternoon   multivitamin-prenatal 27-0.8 MG Tabs tablet Take 1 tablet by mouth daily at 12 noon.   ondansetron 4 MG disintegrating tablet Commonly known as: ZOFRAN-ODT Take  1 tablet (4 mg total) by mouth every 8 (eight) hours as needed for nausea or vomiting.   pantoprazole 40 MG tablet Commonly known as: Protonix Take 1 tablet (40 mg total) by mouth daily.   promethazine 25 MG tablet Commonly known as: PHENERGAN Take 1 tablet (25 mg total) by mouth every 6 (six) hours as needed for nausea or vomiting.   sertraline 100 MG tablet Commonly known as: ZOLOFT Take 1 tablet (100 mg total) by mouth daily.

## 2021-11-24 NOTE — MAU Note (Signed)
Pt reprots she feels like she has had the flu for a few days.Has been coughing and when she coughs it causes her to vomit. Not able to even keep water down. Pt feels like she is dehydrated. Reports some mild cramping as well.

## 2021-11-29 ENCOUNTER — Ambulatory Visit: Payer: BC Managed Care – PPO | Attending: Maternal & Fetal Medicine

## 2021-11-29 ENCOUNTER — Other Ambulatory Visit: Payer: Self-pay

## 2021-11-29 DIAGNOSIS — Z3689 Encounter for other specified antenatal screening: Secondary | ICD-10-CM | POA: Diagnosis not present

## 2021-11-29 DIAGNOSIS — O0991 Supervision of high risk pregnancy, unspecified, first trimester: Secondary | ICD-10-CM | POA: Insufficient documentation

## 2021-11-29 DIAGNOSIS — O10912 Unspecified pre-existing hypertension complicating pregnancy, second trimester: Secondary | ICD-10-CM | POA: Diagnosis not present

## 2021-11-29 DIAGNOSIS — O99210 Obesity complicating pregnancy, unspecified trimester: Secondary | ICD-10-CM

## 2021-11-29 DIAGNOSIS — O10919 Unspecified pre-existing hypertension complicating pregnancy, unspecified trimester: Secondary | ICD-10-CM

## 2021-11-29 DIAGNOSIS — O99212 Obesity complicating pregnancy, second trimester: Secondary | ICD-10-CM | POA: Diagnosis not present

## 2021-11-29 DIAGNOSIS — O10012 Pre-existing essential hypertension complicating pregnancy, second trimester: Secondary | ICD-10-CM | POA: Diagnosis not present

## 2021-11-29 DIAGNOSIS — Z3A18 18 weeks gestation of pregnancy: Secondary | ICD-10-CM | POA: Diagnosis not present

## 2021-12-13 ENCOUNTER — Other Ambulatory Visit: Payer: Self-pay

## 2021-12-13 ENCOUNTER — Encounter: Payer: Self-pay | Admitting: Obstetrics & Gynecology

## 2021-12-13 ENCOUNTER — Ambulatory Visit (INDEPENDENT_AMBULATORY_CARE_PROVIDER_SITE_OTHER): Payer: BC Managed Care – PPO | Admitting: Obstetrics & Gynecology

## 2021-12-13 VITALS — BP 135/83 | HR 98 | Wt 218.0 lb

## 2021-12-13 DIAGNOSIS — O0992 Supervision of high risk pregnancy, unspecified, second trimester: Secondary | ICD-10-CM

## 2021-12-13 DIAGNOSIS — Z3A20 20 weeks gestation of pregnancy: Secondary | ICD-10-CM

## 2021-12-13 DIAGNOSIS — O10919 Unspecified pre-existing hypertension complicating pregnancy, unspecified trimester: Secondary | ICD-10-CM

## 2021-12-13 NOTE — Progress Notes (Signed)
PRENATAL VISIT NOTE  Subjective:  Tina Dixon is a 31 y.o. G1P0 at [redacted]w[redacted]d being seen today for ongoing prenatal care.  She is currently monitored for the following issues for this high-risk pregnancy and has Supervision of high-risk pregnancy; Maternal morbid obesity, antepartum ([place]); Depression affecting pregnancy; and Chronic hypertension during pregnancy on their problem list.  Patient reports no complaints.  Contractions: Not present. Vag. Bleeding: None.  Movement: Present. Denies leaking of fluid.   The following portions of the patient's history were reviewed and updated as appropriate: allergies, current medications, past family history, past medical history, past social history, past surgical history and problem list.   Objective:   Vitals:   12/13/21 1423 12/13/21 1429  BP: (!) 141/86 135/83  Pulse: 99 98  Weight: 218 lb (98.9 kg)     Fetal Status: Fetal Heart Rate (bpm): 146   Movement: Present     General:  Alert, oriented and cooperative. Patient is in no acute distress.  Skin: Skin is warm and dry. No rash noted.   Cardiovascular: Normal heart rate noted  Respiratory: Normal respiratory effort, no problems with respiration noted  Abdomen: Soft, gravid, appropriate for gestational age.  Pain/Pressure: Absent     Pelvic: Cervical exam deferred        Extremities: Normal range of motion.  Edema: None  Mental Status: Normal mood and affect. Normal behavior. Normal judgment and thought content.   Imaging: [place] MFM OB DETAIL +14 WK  Result Date: 11/29/2021 ----------------------------------------------------------------------  OBSTETRICS REPORT                       (Signed Final 11/29/2021 02:14 pm) ---------------------------------------------------------------------- Patient Info  ID #:       [id number]                          D.O.B.:  1991-04-28 (30 yrs)  Name:       Tina Dixon                    Visit Date: 11/29/2021 01:06 pm  ---------------------------------------------------------------------- Performed By  Attending:        Sander Nephew      Ref. Address:     [place]  Performed By:     Germain Osgood            [facility]                                     [facility]  Referred By:      [facility] ---------------------------------------------------------------------- Orders  #  Description                           Code        Ordered By  1  [place] MFM OB DETAIL +14 [place]               [id number]    Verita Schneiders ----------------------------------------------------------------------  #  Order #                     Accession #                Episode #  1  [id number]                   [id number]                 [id number] ---------------------------------------------------------------------- Indications  Encounter for antenatal screening for          Z36.3  malformations  Hypertension - Chronic/Pre-existing (no        O10.019  meds)  Obesity complicating pregnancy, second         O99.212  trimester (BMI 38)  Other mental disorder complicating             O99.340  pregnancy, second trimester  LR NIPS, Neg AFP  [redacted] weeks gestation of pregnancy                Z3A.18 ---------------------------------------------------------------------- Fetal Evaluation  Num Of Fetuses:         1  Fetal Heart Rate(bpm):  155  Cardiac Activity:       Observed  Presentation:           Breech  Placenta:               Anterior  P. Cord Insertion:      Visualized, central  Amniotic Fluid  AFI FV:      Within normal limits                              Largest Pocket(cm)                              4.31 ---------------------------------------------------------------------- Biometry  BPD:      40.2  mm     G. Age:  18w 1d         28  %    CI:         70.29   %    70 - 86                                                          FL/HC:      16.9   %    16.1 - 18.3  HC:      152.9  mm     G. Age:  18w 2d         23  %    HC/AC:      1.21        1.09 - 1.39  AC:  126.2  mm     G. Age:  18w 2d         30  %    FL/BPD:     64.2   %  FL:       25.8  mm     G. Age:  17w 6d         15  %    FL/AC:      20.4   %    20 - 24  CER:      18.5  mm     G. Age:  18w 2d         18  %  NFT:       4.0  mm  LV:        7.2  mm  CM:        3.9  mm  Est. FW:     222  gm      0 lb 8 oz     14  % ---------------------------------------------------------------------- OB History  Gravidity:    1         Term:   0        Prem:   0        SAB:   0  TOP:          0       Ectopic:  0        Living: 0 ---------------------------------------------------------------------- Gestational Age  LMP:           18w 5d        Date:  07/21/21                 EDD:   04/27/22  U/S Today:     18w 1d                                        EDD:   05/01/22  Best:          18w 5d     Det. By:  LMP  (07/21/21)          EDD:   04/27/22 ---------------------------------------------------------------------- Anatomy  Cranium:               Appears normal         Aortic Arch:            Not well visualized  Cavum:                 Appears normal         Ductal Arch:            Not well visualized  Ventricles:            Appears normal         Diaphragm:              Appears normal  Choroid Plexus:        Appears normal         Stomach:                Appears normal, left  sided  Cerebellum:            Appears normal         Abdomen:                Appears normal  Posterior Fossa:       Appears normal         Abdominal Wall:         Appears nml (cord                                                                        insert, abd wall)  Nuchal Fold:           Appears normal         Cord Vessels:           Appears normal (3                                                                         vessel cord)  Face:                  Orbits nl; profile not Kidneys:                Appear normal                         well visualized  Lips:                  Not well visualized    Bladder:                Appears normal  Thoracic:              Appears normal         Spine:                  Ltd views no                                                                        intracranial signs of                                                                        NTD  Heart:                 Not well visualized    Upper Extremities:      Appears normal  RVOT:  Not well visualized    Lower Extremities:      Appears normal  LVOT:                  Not well visualized  Other:  Technically difficult due to maternal habitus and fetal position. Nasal          bone visualized. Fetus appears to be female. ---------------------------------------------------------------------- Cervix Uterus Adnexa  Cervix  Length:           3.69  cm.  Normal appearance by transabdominal scan.  Uterus  No abnormality visualized.  Right Ovary  Within normal limits.  Left Ovary  Within normal limits.  Cul De Sac  No free fluid seen.  Adnexa  No adnexal mass visualized. ---------------------------------------------------------------------- Impression  Single intrauterine pregnancy here for a detailed anatomy  due to elevated BMI  Normal anatomy with measurements consistent with dates  There is good fetal movement and amniotic fluid volume  Suboptimal views of the fetal anatomy were obtained  secondary to fetal position.  Her blood pressure today was 129/81 mmHg  In addition we discussed starting daily low dose ASA for the  prevention of preeclampsia. ---------------------------------------------------------------------- Recommendations  Follow up growth in 4 weeks to complete the fetal anatomy. ----------------------------------------------------------------------                Sander Nephew, MD Electronically Signed Final Report   11/29/2021 02:14 pm ----------------------------------------------------------------------   Assessment and Plan:  Pregnancy: G1P0 at [redacted]w[redacted]d 1. Chronic hypertension during pregnancy Stable BP. On ASA. Continue serial scans as per MFM.   2. [redacted] weeks gestation of pregnancy 3. Supervision of high risk pregnancy in second trimester Suboptiomal anatomy scan, rescan ordered.  Preterm labor symptoms and general obstetric precautions including but not limited to vaginal bleeding, contractions, leaking of fluid and fetal movement were reviewed in detail with the patient. Please refer to After Visit Summary for other counseling recommendations.   Return in about 4 weeks (around 01/10/2022) for OFFICE OB VISIT (MD only, can be virtual)    7-8 weeks from now: 2 hr GTT, labs, OB Visit with MD.  Future Appointments  Date Time Provider [place]  12/27/2021 10:00 AM [facility]    Verita Schneiders, MD

## 2021-12-13 NOTE — Patient Instructions (Signed)
Return to office for any scheduled appointments. Call the office or go to the MAU at [facility] [place] if:  You begin to have strong, frequent contractions  Your water breaks.  Sometimes it is a big gush of fluid, sometimes it is just a trickle that keeps getting your panties wet or running down your legs  You have vaginal bleeding.  It is normal to have a small amount of spotting if your cervix was checked.   You do not feel your baby moving like normal.  If you do not, get something to eat and drink and lay down and focus on feeling your baby move.   If your baby is still not moving like normal, you should call the office or go to MAU.  Any other obstetric concerns.   

## 2021-12-22 ENCOUNTER — Other Ambulatory Visit: Payer: Self-pay

## 2021-12-22 DIAGNOSIS — O99212 Obesity complicating pregnancy, second trimester: Secondary | ICD-10-CM

## 2021-12-22 DIAGNOSIS — O99340 Other mental disorders complicating pregnancy, unspecified trimester: Secondary | ICD-10-CM

## 2021-12-22 DIAGNOSIS — O10919 Unspecified pre-existing hypertension complicating pregnancy, unspecified trimester: Secondary | ICD-10-CM

## 2021-12-27 ENCOUNTER — Other Ambulatory Visit: Payer: Self-pay

## 2021-12-27 ENCOUNTER — Ambulatory Visit: Payer: BC Managed Care – PPO | Attending: Maternal & Fetal Medicine

## 2021-12-27 VITALS — BP 122/75 | HR 90 | Temp 97.5°F | Ht 63.0 in | Wt 220.0 lb

## 2021-12-27 DIAGNOSIS — Z363 Encounter for antenatal screening for malformations: Secondary | ICD-10-CM | POA: Diagnosis not present

## 2021-12-27 DIAGNOSIS — F32A Depression, unspecified: Secondary | ICD-10-CM

## 2021-12-27 DIAGNOSIS — O99212 Obesity complicating pregnancy, second trimester: Secondary | ICD-10-CM

## 2021-12-27 DIAGNOSIS — O10012 Pre-existing essential hypertension complicating pregnancy, second trimester: Secondary | ICD-10-CM | POA: Diagnosis not present

## 2021-12-27 DIAGNOSIS — O99342 Other mental disorders complicating pregnancy, second trimester: Secondary | ICD-10-CM | POA: Diagnosis not present

## 2021-12-27 DIAGNOSIS — O99340 Other mental disorders complicating pregnancy, unspecified trimester: Secondary | ICD-10-CM

## 2021-12-27 DIAGNOSIS — Z3A22 22 weeks gestation of pregnancy: Secondary | ICD-10-CM | POA: Insufficient documentation

## 2021-12-27 DIAGNOSIS — O10919 Unspecified pre-existing hypertension complicating pregnancy, unspecified trimester: Secondary | ICD-10-CM

## 2021-12-27 DIAGNOSIS — O0992 Supervision of high risk pregnancy, unspecified, second trimester: Secondary | ICD-10-CM

## 2021-12-27 DIAGNOSIS — E669 Obesity, unspecified: Secondary | ICD-10-CM

## 2021-12-27 DIAGNOSIS — O99210 Obesity complicating pregnancy, unspecified trimester: Secondary | ICD-10-CM

## 2022-01-11 ENCOUNTER — Encounter: Payer: Self-pay | Admitting: Family Medicine

## 2022-01-11 ENCOUNTER — Other Ambulatory Visit: Payer: Self-pay

## 2022-01-11 ENCOUNTER — Telehealth: Payer: BC Managed Care – PPO | Admitting: Family Medicine

## 2022-01-11 VITALS — BP 129/89 | HR 88 | Wt 218.0 lb

## 2022-01-11 DIAGNOSIS — O0992 Supervision of high risk pregnancy, unspecified, second trimester: Secondary | ICD-10-CM

## 2022-01-11 DIAGNOSIS — Z3A24 24 weeks gestation of pregnancy: Secondary | ICD-10-CM

## 2022-01-11 DIAGNOSIS — O10919 Unspecified pre-existing hypertension complicating pregnancy, unspecified trimester: Secondary | ICD-10-CM

## 2022-01-11 DIAGNOSIS — O10912 Unspecified pre-existing hypertension complicating pregnancy, second trimester: Secondary | ICD-10-CM

## 2022-01-11 DIAGNOSIS — O99212 Obesity complicating pregnancy, second trimester: Secondary | ICD-10-CM

## 2022-01-11 NOTE — Patient Instructions (Signed)

## 2022-01-11 NOTE — Progress Notes (Signed)
OBSTETRICS PRENATAL VIRTUAL VISIT ENCOUNTER NOTE  Provider location: [facility] [place] [facility]   Patient location: Home  I connected with Tina Dixon on 01/11/22 at  9:35 AM EST by MyChart Video Encounter and verified that I am speaking with the correct person using two identifiers. I discussed the limitations, risks, security and privacy concerns of performing an evaluation and management service virtually and the availability of in person appointments. I also discussed with the patient that there may be a patient responsible charge related to this service. The patient expressed understanding and agreed to proceed. Subjective:  Tina Dixon is a 31 y.o. G1P0 at [redacted]w[redacted]d being seen today for ongoing prenatal care.  She is currently monitored for the following issues for this high-risk pregnancy and has Supervision of high-risk pregnancy; Maternal morbid obesity, antepartum ([place]); Depression affecting pregnancy; and Chronic hypertension during pregnancy on their problem list.  Patient reports nausea.  Contractions: Not present. Vag. Bleeding: None.  Movement: Present. Denies any leaking of fluid.   The following portions of the patient's history were reviewed and updated as appropriate: allergies, current medications, past family history, past medical history, past social history, past surgical history and problem list.   Objective:   Vitals:   01/11/22 0932  BP: 129/89  Pulse: 88  Weight: 218 lb (98.9 kg)    Fetal Status:     Movement: Present     General:  Alert, oriented and cooperative. Patient is in no acute distress.  Respiratory: Normal respiratory effort, no problems with respiration noted  Mental Status: Normal mood and affect. Normal behavior. Normal judgment and thought content.  Rest of physical exam deferred due to type of encounter  Imaging: [place] MFM OB FOLLOW UP  Result Date:  12/27/2021 ----------------------------------------------------------------------  OBSTETRICS REPORT                        (Signed Final 12/27/2021 05:11 pm) ---------------------------------------------------------------------- Patient Info  ID #:       [id number]                          D.O.B.:  December 10, 1990 (30 yrs)  Name:       Tina Dixon                    Visit Date: 12/27/2021 09:57 am ---------------------------------------------------------------------- Performed By  Attending:        Sander Nephew      Ref. Address:      [place]  Performed By:     Germain Osgood            [facility] at                                                              [place] By:      [facility] ---------------------------------------------------------------------- Orders  #  Description                           Code        Ordered By  1  [place] MFM OB FOLLOW UP                   [id number]    Verita Schneiders ----------------------------------------------------------------------  #  Order #                     Accession #                Episode #  1  [id number]                   [id number]                 [id number] ---------------------------------------------------------------------- Indications  Encounter for antenatal screening for           Z36.3  malformations  Hypertension - Chronic/Pre-existing (no         O10.019  meds)  Obesity complicating pregnancy, second          O99.212  trimester (BMI 38)  Other mental disorder complicating              O99.340  pregnancy, second trimester  LR NIPS, Neg AFP  [redacted] weeks gestation of pregnancy                 Z3A.22 ---------------------------------------------------------------------- Fetal Evaluation  Num Of Fetuses:          1  Fetal Heart Rate(bpm):   135  Cardiac  Activity:        Observed  Presentation:            Cephalic  Placenta:                Anterior  P. Cord Insertion:       Visualized, central  Amniotic Fluid  AFI FV:      Within normal limits  AFI Sum(cm)     %Tile       Largest Pocket(cm)  5.22            < 3         5.22  RUQ(cm)  5.22 ---------------------------------------------------------------------- Biometry  BPD:      52.7  mm     G. Age:  22w 0d         20  %    CI:        72.92   %    70 - 86                                                          FL/HC:       18.7  %  19.2 - 20.8  HC:      196.2  mm     G. Age:  21w 6d          9  %    HC/AC:       1.15       1.05 - 1.21  AC:      170.7  mm     G. Age:  22w 0d         22  %    FL/BPD:      69.6  %    71 - 87  FL:       36.7  mm     G. Age:  21w 5d         11  %    FL/AC:       21.5  %    20 - 24  HUM:      35.1  mm     G. Age:  22w 0d         31  %  LV:          4  mm  Est. FW:     458   gm          1 lb     11  % ---------------------------------------------------------------------- OB History  Gravidity:    1         Term:   0        Prem:   0        SAB:   0  TOP:          0       Ectopic:  0        Living: 0 ---------------------------------------------------------------------- Gestational Age  LMP:           22w 5d        Date:  07/21/21                 EDD:   04/27/22  U/S Today:     21w 6d                                        EDD:   05/03/22  Best:          22w 5d     Det. By:  LMP  (07/21/21)          EDD:   04/27/22 ---------------------------------------------------------------------- Anatomy  Cranium:               Appears normal         Aortic Arch:            Appears normal  Cavum:                 Appears normal         Ductal Arch:            Appears normal  Ventricles:            Appears normal         Diaphragm:              Appears normal  Choroid Plexus:        Previously seen        Stomach:                Appears normal, left  sided  Cerebellum:            Previously seen        Abdomen:                Appears normal  Posterior Fossa:       Previously seen        Abdominal Wall:         Previously seen  Nuchal Fold:           Previously seen        Cord Vessels:           Previously seen  Face:                  Orbits prev; profile   Kidneys:                Appear normal                         not well visualized  Lips:                  Not well visualized    Bladder:                Appears normal  Thoracic:              Appears normal         Spine:                  Appears normal  Heart:                 Not well visualized    Upper Extremities:      Previously seen  RVOT:                  Appears normal         Lower Extremities:      Previously seen  LVOT:                  Not well visualized  Other:  Technically difficult due to maternal habitus and fetal position. Nasal          bone prev visualized. Fetus appears to be female. ---------------------------------------------------------------------- Cervix Uterus Adnexa  Cervix  Length:           3.51  cm.  Normal appearance by transabdominal scan.  Uterus  No abnormality visualized.  Right Ovary  Not visualized.  Left Ovary  Not visualized.  Cul De Sac  No free fluid seen.  Adnexa  No abnormality visualized. ---------------------------------------------------------------------- Impression  Follow up growth due to elevated BMI and suboptimal  anatomy prevoulsy seen, chronic hypertension (on no meds).  Normal interval growth with measurements consistent with  small for gestational age.  Good fetal movement and amniotic fluid volume  Suboptimal views of the fetal anatomy was again seen. ---------------------------------------------------------------------- Recommendations  Repeat growth in 4 weeks. ----------------------------------------------------------------------               Sander Nephew, MD Electronically Signed Final Report   12/27/2021 05:11 pm  ----------------------------------------------------------------------   Assessment and Plan:  Pregnancy: G1P0 at [redacted]w[redacted]d 1. Maternal morbid obesity, antepartum ([place]) TWG is -17 lb--discussed  2. Supervision of high risk pregnancy in second trimester Continue prenatal care.   3. Chronic hypertension during pregnancy BP is ok on no meds Continue Aspirin EFW last visit 11%--has f/u for growth scheduled  4. Vaginal irritation  Does not think  Preterm labor symptoms and general obstetric precautions including but not limited to vaginal bleeding, contractions, leaking of fluid and fetal movement were reviewed in detail with the patient. I discussed the assessment and treatment plan with the patient. The patient was provided an opportunity to ask questions and all were answered. The patient agreed with the plan and demonstrated an understanding of the instructions. The patient was advised to call back or seek an in-person office evaluation/go to MAU at [facility] for any urgent or concerning symptoms. Please refer to After Visit Summary for other counseling recommendations.   I provided 9 minutes of face-to-face time during this encounter.  Return in 4 weeks (on 02/08/2022) for 28 wk labs, in person.  Future Appointments  Date Time Provider [place]  01/31/2022  8:00 AM [facility]  02/07/2022  8:35 AM Anyanwu, Sallyanne Havers, MD [facility]  02/21/2022  8:35 AM Anyanwu, Sallyanne Havers, MD [facility]  03/07/2022  8:35 AM Anyanwu, Sallyanne Havers, MD [facility]    Donnamae Jude, MD [facility] [place]

## 2022-01-26 ENCOUNTER — Other Ambulatory Visit: Payer: Self-pay

## 2022-01-31 ENCOUNTER — Other Ambulatory Visit: Payer: Self-pay

## 2022-01-31 ENCOUNTER — Ambulatory Visit: Payer: BC Managed Care – PPO | Attending: Obstetrics and Gynecology

## 2022-01-31 DIAGNOSIS — Z362 Encounter for other antenatal screening follow-up: Secondary | ICD-10-CM | POA: Insufficient documentation

## 2022-01-31 DIAGNOSIS — O99342 Other mental disorders complicating pregnancy, second trimester: Secondary | ICD-10-CM

## 2022-01-31 DIAGNOSIS — Z3A27 27 weeks gestation of pregnancy: Secondary | ICD-10-CM

## 2022-01-31 DIAGNOSIS — O10912 Unspecified pre-existing hypertension complicating pregnancy, second trimester: Secondary | ICD-10-CM

## 2022-01-31 DIAGNOSIS — O10012 Pre-existing essential hypertension complicating pregnancy, second trimester: Secondary | ICD-10-CM | POA: Diagnosis not present

## 2022-01-31 DIAGNOSIS — O10919 Unspecified pre-existing hypertension complicating pregnancy, unspecified trimester: Secondary | ICD-10-CM

## 2022-01-31 DIAGNOSIS — E669 Obesity, unspecified: Secondary | ICD-10-CM | POA: Diagnosis not present

## 2022-01-31 DIAGNOSIS — Z363 Encounter for antenatal screening for malformations: Secondary | ICD-10-CM | POA: Diagnosis not present

## 2022-01-31 DIAGNOSIS — F32A Depression, unspecified: Secondary | ICD-10-CM | POA: Diagnosis not present

## 2022-01-31 DIAGNOSIS — O99340 Other mental disorders complicating pregnancy, unspecified trimester: Secondary | ICD-10-CM

## 2022-01-31 DIAGNOSIS — O0993 Supervision of high risk pregnancy, unspecified, third trimester: Secondary | ICD-10-CM

## 2022-01-31 DIAGNOSIS — O99212 Obesity complicating pregnancy, second trimester: Secondary | ICD-10-CM | POA: Diagnosis not present

## 2022-02-07 ENCOUNTER — Encounter: Payer: Self-pay | Admitting: Obstetrics & Gynecology

## 2022-02-07 ENCOUNTER — Ambulatory Visit (INDEPENDENT_AMBULATORY_CARE_PROVIDER_SITE_OTHER): Payer: BC Managed Care – PPO | Admitting: Obstetrics & Gynecology

## 2022-02-07 ENCOUNTER — Other Ambulatory Visit: Payer: Self-pay

## 2022-02-07 VITALS — BP 126/85 | HR 99 | Wt 227.0 lb

## 2022-02-07 DIAGNOSIS — O10919 Unspecified pre-existing hypertension complicating pregnancy, unspecified trimester: Secondary | ICD-10-CM

## 2022-02-07 DIAGNOSIS — O0992 Supervision of high risk pregnancy, unspecified, second trimester: Secondary | ICD-10-CM | POA: Diagnosis not present

## 2022-02-07 DIAGNOSIS — Z23 Encounter for immunization: Secondary | ICD-10-CM | POA: Diagnosis not present

## 2022-02-07 DIAGNOSIS — Z3A28 28 weeks gestation of pregnancy: Secondary | ICD-10-CM

## 2022-02-07 DIAGNOSIS — O99210 Obesity complicating pregnancy, unspecified trimester: Secondary | ICD-10-CM

## 2022-02-07 NOTE — Patient Instructions (Signed)

## 2022-02-07 NOTE — Progress Notes (Signed)
? ?PRENATAL VISIT NOTE ? ?Subjective:  ?Tina Dixon is a 31 y.o. G1P0 at [redacted]w[redacted]d being seen today for ongoing prenatal care.  She is currently monitored for the following issues for this high-risk pregnancy and has Supervision of high-risk pregnancy; Maternal morbid obesity, antepartum ([place]); Depression affecting pregnancy; and Chronic hypertension during pregnancy on their problem list. ? ?Patient reports no complaints.  Contractions: Not present. Vag. Bleeding: None.  Movement: Present. Denies leaking of fluid.  ? ?The following portions of the patient's history were reviewed and updated as appropriate: allergies, current medications, past family history, past medical history, past social history, past surgical history and problem list.  ? ?Objective:  ? ?Vitals:  ? 02/07/22 0839  ?BP: 126/85  ?Pulse: 99  ?Weight: 227 lb (103 kg)  ? ? ?Fetal Status: Fetal Heart Rate (bpm): 140   Movement: Present    ? ?General:  Alert, oriented and cooperative. Patient is in no acute distress.  ?Skin: Skin is warm and dry. No rash noted.   ?Cardiovascular: Normal heart rate noted  ?Respiratory: Normal respiratory effort, no problems with respiration noted  ?Abdomen: Soft, gravid, appropriate for gestational age.  Pain/Pressure: Absent     ?Pelvic: Cervical exam deferred        ?Extremities: Normal range of motion.  Edema: None  ?Mental Status: Normal mood and affect. Normal behavior. Normal judgment and thought content.  ? ?Imaging: ?[place] MFM OB FOLLOW UP ? ?Result Date: 01/31/2022 ?----------------------------------------------------------------------  OBSTETRICS REPORT                       (Signed Final 01/31/2022 09:03 am) ---------------------------------------------------------------------- Patient Info  ID #:       [id number]                          D.O.B.:  1991-09-15 (31 yrs)  Name:       Tina Dixon                    Visit Date: 01/31/2022 08:05 am ----------------------------------------------------------------------  Performed By  Attending:        Tama High MD        Ref. Address:     [place]  Performed By:     Wilnette Kales        [facility]                                                             [facility]  Referred By:  [place] ---------------------------------------------------------------------- Orders  #  Description                           Code        Ordered By  1  [place] MFM OB FOLLOW UP                   [phone]    Tama High ----------------------------------------------------------------------  #  Order #                     Accession #                Episode #  1  [id number]                   [id number]                 [id number] ---------------------------------------------------------------------- Indications  Encounter for antenatal screening for          Z36.3  malformations  Antenatal follow-up for nonvisualized fetal    Z36.2  anatomy  Hypertension - Chronic/Pre-existing (no        O10.019  meds)  Obesity complicating pregnancy, second         O99.212  trimester (BMI 38)  Other mental disorder complicating             O99.340  pregnancy, second trimester (on Zoloft)  LR NIPS, Neg AFP  [redacted] weeks gestation of pregnancy                Z3A.27 ---------------------------------------------------------------------- Fetal Evaluation  Num Of Fetuses:         1  Fetal Heart Rate(bpm):  155  Cardiac Activity:       Observed  Presentation:           Cephalic  Placenta:               Anterior  P. Cord Insertion:      Visualized, central  Amniotic Fluid  AFI FV:      Within normal limits                              Largest Pocket(cm)                              5.1 ---------------------------------------------------------------------- Biometry  BPD:      67.4  mm     G. Age:  27w 1d         21  %    CI:         71.6   %     70 - 86                                                          FL/HC:      20.0   %    18.8 - 20.6  HC:      253.6  mm     G. Age:  27w 4d         17  %    HC/AC:      1.16  1.05 - 1.21  AC:      219.4  mm     G. Age:  26w 3d         10  %    FL/BPD:     75.1   %    71 - 87  FL:       50.6  mm     G. Age:  27w 1d         20  %    FL/AC:      23.1   %    20 - 24  Est. FW:     987  gm      2 lb 3 oz     12  % ---------------------------------------------------------------------- OB History  Gravidity:    1         Term:   0        Prem:   0        SAB:   0  TOP:          0       Ectopic:  0        Living: 0 ---------------------------------------------------------------------- Gestational Age  LMP:           27w 5d        Date:  07/21/21                 EDD:   04/27/22  U/S Today:     27w 1d                                        EDD:   05/01/22  Best:          27w 5d     Det. By:  LMP  (07/21/21)          EDD:   04/27/22 ---------------------------------------------------------------------- Anatomy  Cranium:               Appears normal         Aortic Arch:            Previously seen  Cavum:                 Appears normal         Ductal Arch:            Previously seen  Ventricles:            Appears normal         Diaphragm:              Appears normal  Choroid Plexus:        Appears normal         Stomach:                Appears normal, left                                                                        sided  Cerebellum:            Appears normal         Abdomen:  Appears normal  Posterior Fossa:       Appears normal         Abdominal Wall:         Appears nml (cord                                                                        insert, abd wall)  Nuchal Fold:           Previously seen        Cord Vessels:           Appears normal (3                                                                        vessel cord)  Face:                  Profile appears        Kidneys:                 Previously seen                         normal, orbits prev.  Lips:                  Appears normal         Bladder:                Appears normal  Thoracic:              Appears normal         Spine:                  Previously seen  Heart:                 Appears normal         Upper Extremities:      Previously seen                         (4CH, axis, and                         situs)  RVOT:                  Appears normal         Lower Extremities:      Previously seen  LVOT:                  Appears normal  Other:  Technically difficult due to maternal habitus and fetal position. Nasal          bone visualized. Fetus appears to be female. ---------------------------------------------------------------------- Cervix Uterus Adnexa  Cervix  Length:           4.94  cm.  Normal appearance by transabdominal scan.  Uterus  No abnormality visualized. ---------------------------------------------------------------------- Impression  Chronic hypertension.  Well-controlled without  antihypertensives.  Blood pressure today at her office is  110/81 mmHg.  Patient will be undergoing screening for gestational diabetes  next week.  Fetal growth is appropriate for gestational age.  Good interval  fetal growth is seen.  The estimated fetal weight is at the 12  percentile.  Amniotic fluid is normal and good fetal activity  seen.  Cephalic presentation.  I reassured the patient of the findings ---------------------------------------------------------------------- Recommendations  -An appointment was made for her to return in 4 weeks for  fetal growth assessment. ----------------------------------------------------------------------                  Tama High, MD Electronically Signed Final Report   01/31/2022 09:03 am ----------------------------------------------------------------------  ? ?Assessment and Plan:  ?Pregnancy: G1P0 at [redacted]w[redacted]d ?1. Chronic hypertension during pregnancy ?Stable BP today, no meds. Continue  ASA.  Continue scans as per MFM. Surveillance CMP checked with other labs today. ?- Comprehensive metabolic panel ? ?2. Maternal morbid obesity, antepartum ([place]) ?TWG -8 lbs due to N/V earlier. Doing well.

## 2022-02-08 ENCOUNTER — Encounter: Payer: Self-pay | Admitting: Obstetrics & Gynecology

## 2022-02-08 DIAGNOSIS — O24419 Gestational diabetes mellitus in pregnancy, unspecified control: Secondary | ICD-10-CM

## 2022-02-08 HISTORY — DX: Gestational diabetes mellitus in pregnancy, unspecified control: O24.419

## 2022-02-08 LAB — CBC
Hematocrit: 36.6 % (ref 34.0–46.6)
Hemoglobin: 12.3 g/dL (ref 11.1–15.9)
MCH: 30.1 pg (ref 26.6–33.0)
MCHC: 33.6 g/dL (ref 31.5–35.7)
MCV: 90 fL (ref 79–97)
Platelets: 364 10*3/uL (ref 150–450)
RBC: 4.08 x10E6/uL (ref 3.77–5.28)
RDW: 12.3 % (ref 11.7–15.4)
WBC: 9.9 10*3/uL (ref 3.4–10.8)

## 2022-02-08 LAB — COMPREHENSIVE METABOLIC PANEL
ALT: 9 IU/L (ref 0–32)
AST: 8 IU/L (ref 0–40)
Albumin/Globulin Ratio: 1.2 (ref 1.2–2.2)
Albumin: 3.5 g/dL — ABNORMAL LOW (ref 3.9–5.0)
Alkaline Phosphatase: 100 IU/L (ref 44–121)
BUN/Creatinine Ratio: 10 (ref 9–23)
BUN: 6 mg/dL (ref 6–20)
Bilirubin Total: 0.2 mg/dL (ref 0.0–1.2)
CO2: 19 mmol/L — ABNORMAL LOW (ref 20–29)
Calcium: 8.7 mg/dL (ref 8.7–10.2)
Chloride: 103 mmol/L (ref 96–106)
Creatinine, Ser: 0.6 mg/dL (ref 0.57–1.00)
Globulin, Total: 3 g/dL (ref 1.5–4.5)
Glucose: 99 mg/dL (ref 70–99)
Potassium: 4.1 mmol/L (ref 3.5–5.2)
Sodium: 138 mmol/L (ref 134–144)
Total Protein: 6.5 g/dL (ref 6.0–8.5)
eGFR: 124 mL/min/{1.73_m2} (ref >59)

## 2022-02-08 LAB — HIV ANTIBODY (ROUTINE TESTING W REFLEX): HIV Screen 4th Generation wRfx: NONREACTIVE

## 2022-02-08 LAB — GLUCOSE TOLERANCE, 2 HOURS W/ 1HR
Glucose, 1 hour: 154 mg/dL (ref 70–179)
Glucose, 2 hour: 98 mg/dL (ref 70–152)
Glucose, Fasting: 95 mg/dL — ABNORMAL HIGH (ref 70–91)

## 2022-02-08 LAB — RPR: RPR Ser Ql: NONREACTIVE

## 2022-02-21 ENCOUNTER — Encounter: Payer: Self-pay | Admitting: Obstetrics & Gynecology

## 2022-02-21 ENCOUNTER — Ambulatory Visit (INDEPENDENT_AMBULATORY_CARE_PROVIDER_SITE_OTHER): Payer: BC Managed Care – PPO | Admitting: Obstetrics & Gynecology

## 2022-02-21 VITALS — BP 129/83 | HR 98 | Wt 228.0 lb

## 2022-02-21 DIAGNOSIS — Z3A30 30 weeks gestation of pregnancy: Secondary | ICD-10-CM

## 2022-02-21 DIAGNOSIS — O0993 Supervision of high risk pregnancy, unspecified, third trimester: Secondary | ICD-10-CM

## 2022-02-21 DIAGNOSIS — O10913 Unspecified pre-existing hypertension complicating pregnancy, third trimester: Secondary | ICD-10-CM

## 2022-02-21 DIAGNOSIS — O10919 Unspecified pre-existing hypertension complicating pregnancy, unspecified trimester: Secondary | ICD-10-CM

## 2022-02-21 DIAGNOSIS — O99210 Obesity complicating pregnancy, unspecified trimester: Secondary | ICD-10-CM

## 2022-02-21 DIAGNOSIS — O99213 Obesity complicating pregnancy, third trimester: Secondary | ICD-10-CM

## 2022-02-21 DIAGNOSIS — O24419 Gestational diabetes mellitus in pregnancy, unspecified control: Secondary | ICD-10-CM

## 2022-02-21 MED ORDER — ACCU-CHEK GUIDE VI STRP: ORAL_STRIP | 12 refills | Status: DC

## 2022-02-21 MED ORDER — ACCU-CHEK SOFTCLIX LANCETS MISC: 1.0000 | Freq: Four times a day (QID) | 12 refills | Status: DC

## 2022-02-21 MED ORDER — ACCU-CHEK GUIDE W/DEVICE KIT: 1.0000 | PACK | Freq: Four times a day (QID) | 0 refills | Status: DC

## 2022-02-21 NOTE — Progress Notes (Signed)
? ?  PRENATAL VISIT NOTE ? ?Subjective:  ?Tina Dixon is a 31 y.o. G1P0 at [redacted]w[redacted]d being seen today for ongoing prenatal care.  She is currently monitored for the following issues for this high-risk pregnancy and has Supervision of high-risk pregnancy; Maternal morbid obesity, antepartum ([place]); Depression affecting pregnancy; Chronic hypertension during pregnancy; and Gestational diabetes mellitus, antepartum on their problem list. ? ?Patient reports no complaints. Has not had DM education yet. Contractions: Not present. Vag. Bleeding: None.  Movement: Present. Denies leaking of fluid.  ? ?The following portions of the patient's history were reviewed and updated as appropriate: allergies, current medications, past family history, past medical history, past social history, past surgical history and problem list.  ? ?Objective:  ? ?Vitals:  ? 02/21/22 0838  ?BP: 129/83  ?Pulse: 98  ?Weight: 228 lb (103.4 kg)  ? ? ?Fetal Status: Fetal Heart Rate (bpm): 145   Movement: Present    ? ?General:  Alert, oriented and cooperative. Patient is in no acute distress.  ?Skin: Skin is warm and dry. No rash noted.   ?Cardiovascular: Normal heart rate noted  ?Respiratory: Normal respiratory effort, no problems with respiration noted  ?Abdomen: Soft, gravid, appropriate for gestational age.  Pain/Pressure: Present     ?Pelvic: Cervical exam deferred        ?Extremities: Normal range of motion.  Edema: None  ?Mental Status: Normal mood and affect. Normal behavior. Normal judgment and thought content.  ? ?Assessment and Plan:  ?Pregnancy: G1P0 at [redacted]w[redacted]d ?1. Gestational diabetes mellitus (GDM) in third trimester, gestational diabetes method of control unspecified ?Referral sent, testing supplies to be prescribed.  Will evaluate sugars next visit.  Information given to her to review at home.  ?- Amb Referral to Nutrition and Diabetic Education ? ?2. Chronic hypertension during pregnancy ?Stable BP, continue AA ? ?3. Maternal morbid obesity,  antepartum ([place]) ?TWG -7 lbs. Growth scan scheduled on 02/28/2022. ? ?4. [redacted] weeks gestation of pregnancy ?5. Supervision of high risk pregnancy in third trimester ?No other complaints . Preterm labor symptoms and general obstetric precautions including but not limited to vaginal bleeding, contractions, leaking of fluid and fetal movement were reviewed in detail with the patient. ?Please refer to After Visit Summary for other counseling recommendations.  ? ?Return in about 2 weeks (around 03/07/2022) for OFFICE OB VISIT (MD only). ? ?Future Appointments  ?Date Time Provider [place]  ?02/28/2022  9:00 AM [facility]  ?03/07/2022  8:35 AM Kami Kube, Sallyanne Havers, MD [facility]  ?03/21/2022 10:35 AM Nolyn Swab, Sallyanne Havers, MD [facility]  ?04/04/2022 10:55 AM Aletha Halim, MD [facility]  ?04/11/2022 10:15 AM Constant, Vickii Chafe, MD [facility]  ?04/18/2022 10:15 AM Aletha Halim, MD [facility]  ?04/25/2022 10:15 AM Aletha Halim, MD [facility]  ? ? ?Verita Schneiders, MD ? ?

## 2022-02-21 NOTE — Patient Instructions (Signed)
Return to office for any scheduled appointments. Call the office or go to the MAU at [facility] [place] if:  You begin to have strong, frequent contractions  Your water breaks.  Sometimes it is a big gush of fluid, sometimes it is just a trickle that keeps getting your panties wet or running down your legs  You have vaginal bleeding.  It is normal to have a small amount of spotting if your cervix was checked.   You do not feel your baby moving like normal.  If you do not, get something to eat and drink and lay down and focus on feeling your baby move.   If your baby is still not moving like normal, you should call the office or go to MAU.  Any other obstetric concerns.   

## 2022-02-23 ENCOUNTER — Other Ambulatory Visit: Payer: Self-pay

## 2022-02-23 DIAGNOSIS — F32A Depression, unspecified: Secondary | ICD-10-CM

## 2022-02-23 DIAGNOSIS — O24410 Gestational diabetes mellitus in pregnancy, diet controlled: Secondary | ICD-10-CM

## 2022-02-23 DIAGNOSIS — O10919 Unspecified pre-existing hypertension complicating pregnancy, unspecified trimester: Secondary | ICD-10-CM

## 2022-02-23 DIAGNOSIS — O99213 Obesity complicating pregnancy, third trimester: Secondary | ICD-10-CM

## 2022-02-28 ENCOUNTER — Ambulatory Visit (HOSPITAL_BASED_OUTPATIENT_CLINIC_OR_DEPARTMENT_OTHER): Payer: BC Managed Care – PPO | Admitting: Obstetrics and Gynecology

## 2022-02-28 ENCOUNTER — Other Ambulatory Visit: Payer: Self-pay

## 2022-02-28 ENCOUNTER — Ambulatory Visit: Payer: BC Managed Care – PPO | Attending: Obstetrics and Gynecology

## 2022-02-28 VITALS — BP 132/86 | HR 97 | Temp 98.0°F | Ht 63.0 in | Wt 227.5 lb

## 2022-02-28 DIAGNOSIS — O10919 Unspecified pre-existing hypertension complicating pregnancy, unspecified trimester: Secondary | ICD-10-CM

## 2022-02-28 DIAGNOSIS — Z3A31 31 weeks gestation of pregnancy: Secondary | ICD-10-CM | POA: Diagnosis not present

## 2022-02-28 DIAGNOSIS — E669 Obesity, unspecified: Secondary | ICD-10-CM

## 2022-02-28 DIAGNOSIS — O99343 Other mental disorders complicating pregnancy, third trimester: Secondary | ICD-10-CM | POA: Insufficient documentation

## 2022-02-28 DIAGNOSIS — O24419 Gestational diabetes mellitus in pregnancy, unspecified control: Secondary | ICD-10-CM | POA: Insufficient documentation

## 2022-02-28 DIAGNOSIS — O365930 Maternal care for other known or suspected poor fetal growth, third trimester, not applicable or unspecified: Secondary | ICD-10-CM | POA: Insufficient documentation

## 2022-02-28 DIAGNOSIS — O99213 Obesity complicating pregnancy, third trimester: Secondary | ICD-10-CM

## 2022-02-28 DIAGNOSIS — O10013 Pre-existing essential hypertension complicating pregnancy, third trimester: Secondary | ICD-10-CM | POA: Diagnosis not present

## 2022-02-28 DIAGNOSIS — O24410 Gestational diabetes mellitus in pregnancy, diet controlled: Secondary | ICD-10-CM

## 2022-02-28 DIAGNOSIS — O0993 Supervision of high risk pregnancy, unspecified, third trimester: Secondary | ICD-10-CM

## 2022-02-28 DIAGNOSIS — O99340 Other mental disorders complicating pregnancy, unspecified trimester: Secondary | ICD-10-CM

## 2022-02-28 DIAGNOSIS — F32A Depression, unspecified: Secondary | ICD-10-CM

## 2022-02-28 NOTE — Progress Notes (Signed)
Maternal-Fetal Medicine  ? ?Name: Tina Dixon ?DOB: 18-Apr-1991 ?MRN: [id number] ?Referring Provider: Verita Schneiders, MD ? ?I had the pleasure of seeing Ms. Kovalchuk today at [facility].  She is G1 P0 at [place] gestation and is here for fetal growth assessment. She has chronic hypertension that is well-controlled without antihypertensives. ?Patient has a newly-diagnosed gestational diabetes. She will start checking her blood glucose from now.  ?Medications: Prenatal vitamins, low-dose aspirin, sertraline. ?Allergies: No known drug allergies. ?Blood pressure today at her office is 140/95 mmHg and repeat 132/86 mm Hg. ? ?Ultrasound ?On today's ultrasound, amniotic fluid is normal and good fetal activity seen.  The estimated fetal weight is at the 4th percentile.  Abdominal circumference measurement is at the 1st percentile.  Head circumference measurement is at -1 SD (normal).  Umbilical artery Doppler showed normal forward diastolic flow. ? ?Fetal growth restriction ?I explained the finding of fetal growth restriction that is difficult to differentiate from a constitutionally small for gestational age fetus. ?I discussed the possible causes of fetal growth restriction including placental insufficiency (most common cause), chromosomal anomalies and fetal infections.  Patient did not give history of fever or rashes. Chronic hypertension can be associated with placental insufficiency. ?I discussed ultrasound protocol of monitoring fetal growth restriction. ?Timing of delivery: Since small for gestational age fetuses have a higher chance of having perinatal mortality and morbidity, delivery at 44 to [redacted] weeks gestation is reasonable.  If, however, severe fetal growth restriction is seen with normal antenatal testing, we will recommend delivery at [redacted] weeks gestation. ? ?Gestational diabetes ?I explained the diagnosis of gestational diabetes.  I emphasized the importance of good blood glucose control to prevent  adverse fetal or neonatal outcomes.  I discussed blood glucose normal values. I encouraged her to check her blood glucose regularly. ?Possible complications of gestational diabetes include fetal macrosomia, shoulder dystocia and birth injuries, stillbirth (in poorly controlled diabetes) and neonatal respiratory syndrome and other complications. Her pregnancy is complicated by fetal growth restriction. ? ?In about 85% of cases, gestational diabetes is well controlled by diet alone.  Exercise reduces the need for insulin.  Medical treatment includes oral hypoglycemics or insulin. ? ?Timing of delivery: In well-controlled diabetes on diet, patient can be delivered at 98- or 40-weeks' gestation. Vaginal delivery is not contraindicated. ?Type 2 diabetes develops in up to 50% of women with GDM. I recommend postpartum screening with 75-g glucose load at 6 to 12 weeks after delivery. ? ?Recommendations ?-Weekly BPP, NST and UA Doppler studies to continue. ?-Fetal growth assessment in 3 weeks. ?-We will recommend timing of delivery after next fetal growth assessment. ?-Encouraged patient to check her blood glucose regularly. ? ?Thank you for consultation.  If you have any questions or concerns, please contact me the [facility].  Consultation including face-to-face counseling (more than 50% of time spent) is 45 minutes. ? ? ? ? ?

## 2022-02-28 NOTE — Procedures (Signed)
Tina Dixon Tina Dixon ?02-21-1991 ?[redacted]w[redacted]d ? ?Fetus A Non-Stress Test Interpretation for 02/28/22 ? ?Indication: IUGR ? ?Fetal Heart Rate A ?Mode: External ?Baseline Rate (A): 140 bpm ?Variability: Moderate ?Accelerations: 15 x 15 ?Decelerations: None ?Multiple birth?: No ? ?Uterine Activity ?Mode: Toco ? ?Interpretation (Fetal Testing) ?Nonstress Test Interpretation: Reactive (Per Dr. Judeth Cornfield) ? ? ?

## 2022-03-01 ENCOUNTER — Encounter: Payer: Self-pay | Admitting: Obstetrics & Gynecology

## 2022-03-02 ENCOUNTER — Other Ambulatory Visit: Payer: Self-pay

## 2022-03-02 DIAGNOSIS — O365930 Maternal care for other known or suspected poor fetal growth, third trimester, not applicable or unspecified: Secondary | ICD-10-CM

## 2022-03-02 DIAGNOSIS — O99213 Obesity complicating pregnancy, third trimester: Secondary | ICD-10-CM

## 2022-03-02 DIAGNOSIS — O24419 Gestational diabetes mellitus in pregnancy, unspecified control: Secondary | ICD-10-CM

## 2022-03-02 DIAGNOSIS — O10919 Unspecified pre-existing hypertension complicating pregnancy, unspecified trimester: Secondary | ICD-10-CM

## 2022-03-02 DIAGNOSIS — F32A Depression, unspecified: Secondary | ICD-10-CM

## 2022-03-06 ENCOUNTER — Encounter: Payer: BC Managed Care – PPO | Attending: Obstetrics & Gynecology | Admitting: *Deleted

## 2022-03-06 ENCOUNTER — Encounter: Payer: Self-pay | Admitting: *Deleted

## 2022-03-06 VITALS — BP 104/70 | Ht 63.0 in | Wt 229.5 lb

## 2022-03-06 DIAGNOSIS — O24419 Gestational diabetes mellitus in pregnancy, unspecified control: Secondary | ICD-10-CM | POA: Insufficient documentation

## 2022-03-06 DIAGNOSIS — O24410 Gestational diabetes mellitus in pregnancy, diet controlled: Secondary | ICD-10-CM

## 2022-03-06 DIAGNOSIS — Z3A30 30 weeks gestation of pregnancy: Secondary | ICD-10-CM | POA: Insufficient documentation

## 2022-03-06 NOTE — Progress Notes (Signed)
Diabetes Self-Management Education ? ?Visit Type: First/Initial ? ?Appt. Start Time: 1000 Appt. End Time: 1125 ? ?03/06/2022 ? ?[name], identified by name and date of birth, is a 31 y.o. female with a diagnosis of Diabetes: Gestational Diabetes.  ? ?ASSESSMENT ? ?Blood pressure 104/70, height 5\' 3"  (1.6 m), weight 229 lb 8 oz (104.1 kg), last menstrual period 07/21/2021, estimated date of delivery 04/27/2022 ?Body mass index is 40.65 kg/m?. ? ? Diabetes Self-Management Education - 03/06/22 1058   ? ?  ? Visit Information  ? Visit Type First/Initial   ?  ? Initial Visit  ? Diabetes Type Gestational Diabetes   ? Date Diagnosed "3 weeks"   ? Are you currently following a meal plan? Yes   ? What type of meal plan do you follow? recently stopped drinking sodas   ? Are you taking your medications as prescribed? Yes   ?  ? Health Coping  ? How would you rate your overall health? Fair   ?  ? Psychosocial Assessment  ? Patient Belief/Attitude about Diabetes Other (comment)   "It's a lot to take in"  ? What is the hardest part about your diabetes right now, causing you the most concern, or is the most worrisome to you about your diabetes?   Making healty food and beverage choices   ? Self-care barriers None   ? Self-management support Doctor's office;Friends;Family   ? Patient Concerns Nutrition/Meal planning;Glycemic Control;Healthy Lifestyle   ? Special Needs None   ? Preferred Learning Style Auditory;Other (comment)   talking/discussion  ? Learning Readiness Change in progress   ? How often do you need to have someone help you when you read instructions, pamphlets, or other written materials from your doctor or pharmacy? 1 - Never   ? What is the last grade level you completed in school? college   ?  ? Pre-Education Assessment  ? Patient understands the diabetes disease and treatment process. Needs Instruction   ? Patient understands incorporating nutritional management into lifestyle. Needs Instruction   ?  Patient undertands incorporating physical activity into lifestyle. Needs Instruction   ? Patient understands using medications safely. Needs Instruction   ? Patient understands monitoring blood glucose, interpreting and using results Needs Review   ? Patient understands prevention, detection, and treatment of acute complications. Needs Instruction   ? Patient understands prevention, detection, and treatment of chronic complications. Needs Instruction   ? Patient understands how to develop strategies to address psychosocial issues. Needs Instruction   ? Patient understands how to develop strategies to promote health/change behavior. Needs Instruction   ?  ? Complications  ? Last HgB A1C per patient/outside source 5.4 %   10/19/2021  ? How often do you check your blood sugar? 3-4 times/day   She didn't bring her blood sugar records or meter.  ? Fasting Blood glucose range (mg/dL) 14/05/2021   She reports all fasting readings above 95 mg/dL.  ? Postprandial Blood glucose range (mg/dL) 40-973   She reports post meal readings less than 120's mg/dL.  ? Have you had a dilated eye exam in the past 12 months? Yes   ? Have you had a dental exam in the past 12 months? No   ? Are you checking your feet? No   ?  ? Dietary Intake  ? Breakfast Glucerna shake or fruit smoothie with almond milk, greek yogurt   ? Snack (morning) Belvita cookies, frit (graps, oranges, mango, strawberries)   ? Lunch string cheese;  ham or [place] sandwich   ? Dinner chicken, fish; bread, beans, corn, rice, pasta, brooli, okra, asparagus; salads with grilled chicken, tomatoes, onions, cuccumbers, cheese   ? Snack (evening) SF jello; fruit smoothie   ? Beverage(s) water, milk   ?  ? Activity / Exercise  ? Activity / Exercise Type Light (walking / raking leaves)   ? How many days per week to you exercise? 2   ? How many minutes per day do you exercise? 30   ? Total minutes per week of exercise 60   ?  ? Patient Education  ? Previous Diabetes Education No   ?  Disease Pathophysiology Definition of diabetes, type 1 and 2, and the diagnosis of diabetes;Factors that contribute to the development of diabetes   ? Healthy Eating Role of diet in the treatment of diabetes and the relationship between the three main macronutrients and blood glucose level;Food label reading, portion sizes and measuring food.;Reviewed blood glucose goals for pre and post meals and how to evaluate the patients' food intake on their blood glucose level.   ? Being Active Role of exercise on diabetes management, blood pressure control and cardiac health.   ? Medications Other (comment)   Limited use or oral medications during pregnancy and potential for insulin  ? Monitoring Purpose and frequency of SMBG.;Taught/discussed recording of test results and interpretation of SMBG.;Identified appropriate SMBG and/or A1C goals.;Ketone testing, when, how.   ? Chronic complications Relationship between chronic complications and blood glucose control   ? Diabetes Stress and Support Identified and addressed patients feelings and concerns about diabetes   ? Preconception care Pregnancy and GDM  Role of pre-pregnancy blood glucose control on the development of the fetus;Reviewed with patient blood glucose goals with pregnancy;Role of family planning for patients with diabetes   ?  ? Individualized Goals (developed by patient)  ? Reducing Risk Other (comment)   improve blood sugars, lead a healthier lifestyle  ?  ? Outcomes  ? Expected Outcomes Demonstrated interest in learning. Expect positive outcomes   ? ?  ?  ? ?Individualized Plan for Diabetes Self-Management Training:  ? ?Learning Objective:  Patient will have a greater understanding of diabetes self-management. ?Patient education plan is to attend individual and/or group sessions per assessed needs and concerns. ?  ?Plan:  ? ?Patient Instructions  ?Read booklet on Gestational Diabetes ?Follow Gestational Meal Planning Guidelines ?Don't skip meals - eat at least  1 protein and 1 carbohydrate serving ?Eat 1 serving of protein when eating fruit for a snack ?Avoid fruit at bedtime ?Complete a 3 Day Food Record and bring to next appointment ?Check blood sugars 4 x day - before breakfast and 2 hrs after every meal and record  ?Bring blood sugar log to all appointments ?Purchase urine ketone strips if instructed by MD and check urine ketones every am:  If + increase bedtime snack to 1 protein and 2 carbohydrate servings ?Walk 20-30 minutes at least 5 x week if permitted by MD ? ?Expected Outcomes:  Demonstrated interest in learning. Expect positive outcomes ? ?Education material provided:  ?Gestational Booklet ?Gestational Meal Planning Guidelines ?Simple Meal Plan ?Viewed Gestational Diabetes Video ?3 Day Food Record ?Goals for a Healthy Pregnancy ?  ?If problems or questions, patient to contact team via:   ?Sharion Settler, RN, CCM, CDCES [phone] ? ?Future DSME appointment:  ?Mar 15, 2022 with the dietitian ?

## 2022-03-06 NOTE — Patient Instructions (Signed)
Read booklet on Gestational Diabetes ?Follow Gestational Meal Planning Guidelines ?Don't skip meals - eat at least 1 protein and 1 carbohydrate serving ?Eat 1 serving of protein when eating fruit for a snack ?Avoid fruit at bedtime ?Complete a 3 Day Food Record and bring to next appointment ?Check blood sugars 4 x day - before breakfast and 2 hrs after every meal and record  ?Bring blood sugar log to all appointments ?Purchase urine ketone strips if instructed by MD and check urine ketones every am:  If + increase bedtime snack to 1 protein and 2 carbohydrate servings ?Walk 20-30 minutes at least 5 x week if permitted by MD ? ?

## 2022-03-07 ENCOUNTER — Ambulatory Visit (HOSPITAL_BASED_OUTPATIENT_CLINIC_OR_DEPARTMENT_OTHER): Payer: BC Managed Care – PPO

## 2022-03-07 ENCOUNTER — Encounter: Payer: Self-pay | Admitting: Obstetrics & Gynecology

## 2022-03-07 ENCOUNTER — Ambulatory Visit (INDEPENDENT_AMBULATORY_CARE_PROVIDER_SITE_OTHER): Payer: BC Managed Care – PPO | Admitting: Obstetrics & Gynecology

## 2022-03-07 ENCOUNTER — Other Ambulatory Visit: Payer: Self-pay

## 2022-03-07 ENCOUNTER — Ambulatory Visit: Payer: BC Managed Care – PPO | Attending: Maternal & Fetal Medicine | Admitting: Maternal & Fetal Medicine

## 2022-03-07 VITALS — BP 130/84 | HR 99 | Wt 229.6 lb

## 2022-03-07 VITALS — BP 131/64 | HR 106 | Temp 97.8°F | Ht 63.0 in | Wt 230.5 lb

## 2022-03-07 DIAGNOSIS — O365930 Maternal care for other known or suspected poor fetal growth, third trimester, not applicable or unspecified: Secondary | ICD-10-CM | POA: Insufficient documentation

## 2022-03-07 DIAGNOSIS — O99213 Obesity complicating pregnancy, third trimester: Secondary | ICD-10-CM | POA: Diagnosis not present

## 2022-03-07 DIAGNOSIS — F32A Depression, unspecified: Secondary | ICD-10-CM

## 2022-03-07 DIAGNOSIS — O24419 Gestational diabetes mellitus in pregnancy, unspecified control: Secondary | ICD-10-CM | POA: Insufficient documentation

## 2022-03-07 DIAGNOSIS — E669 Obesity, unspecified: Secondary | ICD-10-CM

## 2022-03-07 DIAGNOSIS — O99340 Other mental disorders complicating pregnancy, unspecified trimester: Secondary | ICD-10-CM

## 2022-03-07 DIAGNOSIS — O10919 Unspecified pre-existing hypertension complicating pregnancy, unspecified trimester: Secondary | ICD-10-CM

## 2022-03-07 DIAGNOSIS — O0993 Supervision of high risk pregnancy, unspecified, third trimester: Secondary | ICD-10-CM

## 2022-03-07 DIAGNOSIS — O10013 Pre-existing essential hypertension complicating pregnancy, third trimester: Secondary | ICD-10-CM | POA: Diagnosis not present

## 2022-03-07 DIAGNOSIS — O10913 Unspecified pre-existing hypertension complicating pregnancy, third trimester: Secondary | ICD-10-CM | POA: Insufficient documentation

## 2022-03-07 DIAGNOSIS — Z3A32 32 weeks gestation of pregnancy: Secondary | ICD-10-CM | POA: Diagnosis not present

## 2022-03-07 DIAGNOSIS — O99343 Other mental disorders complicating pregnancy, third trimester: Secondary | ICD-10-CM | POA: Insufficient documentation

## 2022-03-07 MED ORDER — METFORMIN HCL 500 MG PO TABS: 500.0000 mg | ORAL_TABLET | Freq: Every day | ORAL | 2 refills | Status: DC

## 2022-03-07 NOTE — Patient Instructions (Signed)
Return to office for any scheduled appointments. Call the office or go to the MA[facility] at [facility] [place] if: You begin to have strong, frequent contractions Your water breaks.  Sometimes it is a big gush of fluid, sometimes it is just a trickle that keeps getting your underwear wet or running down your legs You have vaginal bleeding.  It is normal to have a small amount of spotting if your cervix was checked.  You do not feel your baby moving like normal.  If you do not, get something to eat and drink and lay down and focus on feeling your baby move.   If your baby is still not moving like normal, you should call the office or go to MAU. Any other obstetric concerns.  

## 2022-03-07 NOTE — Procedures (Signed)
Tina Dixon [name] ?October 30, 1991 ?[redacted]w[redacted]d ? ?Fetus A Non-Stress Test Interpretation for 03/07/22 ? ?Indication: IUGR, Chronic Hypertenstion, and Gestational Diabetes medication controlled ? ?Fetal Heart Rate A ?Mode: External ?Baseline Rate (A): 140 bpm ?Variability: Moderate ?Accelerations: 15 x 15 ?Decelerations: None ?Multiple birth?: No ? ?Uterine Activity ?Mode: Toco ? ?Interpretation (Fetal Testing) ?Nonstress Test Interpretation: Reactive (Per Dr. Aurea Graff Day) ? ? ?

## 2022-03-07 NOTE — Progress Notes (Signed)
? ?PRENATAL VISIT NOTE ? ?Subjective:  ?Tina Dixon is a 31 y.o. G1P0 at [redacted]w[redacted]d being seen today for ongoing prenatal care.  She is currently monitored for the following issues for this high-risk pregnancy and has Supervision of high-risk pregnancy; Maternal morbid obesity, antepartum ([place]); Depression affecting pregnancy; Chronic hypertension during pregnancy; Gestational diabetes mellitus, antepartum; and IUGR (intrauterine growth restriction) affecting care of mother, third trimester on their problem list. ? ?Patient reports no complaints.  Contractions: Not present. Vag. Bleeding: None.  Movement: Present. Denies leaking of fluid.  ? ?The following portions of the patient's history were reviewed and updated as appropriate: allergies, current medications, past family history, past medical history, past social history, past surgical history and problem list.  ? ?Objective:  ? ?Vitals:  ? 03/07/22 0828  ?BP: 130/84  ?Pulse: 99  ?Weight: 229 lb 9.6 oz (104.1 kg)  ? ? ?Fetal Status: Fetal Heart Rate (bpm): 139   Movement: Present    ? ?General:  Alert, oriented and cooperative. Patient is in no acute distress.  ?Skin: Skin is warm and dry. No rash noted.   ?Cardiovascular: Normal heart rate noted  ?Respiratory: Normal respiratory effort, no problems with respiration noted  ?Abdomen: Soft, gravid, appropriate for gestational age.  Pain/Pressure: Absent     ?Pelvic: Cervical exam deferred        ?Extremities: Normal range of motion.  Edema: None  ?Mental Status: Normal mood and affect. Normal behavior. Normal judgment and thought content.  ? ?Assessment and Plan:  ?Pregnancy: G1P0 at [redacted]w[redacted]d ?1. Gestational diabetes mellitus (GDM), antepartum, gestational diabetes method of control unspecified ?Fastings all over 100 102-110s, PP mostly <120.   Discussed with patient, recommended insulin vs Metformin. She desires Metformin, this was prescribed.  Patient is already scheduled for weekly BPP due to IUGR, has ultrasound  today. Likely IOL at 37 weeks or earlier if indicated. ?- metFORMIN (GLUCOPHAGE) 500 MG tablet; Take 1 tablet (500 mg total) by mouth at bedtime.  Dispense: 30 tablet; Refill: 2 ? ?2. IUGR (intrauterine growth restriction) affecting care of mother, third trimester ?Continue weekly BPP and dopplers as per MFM.  Likely IOL at 37 weeks or earlier if indicated. ? ?3. Chronic hypertension during pregnancy ?Stable BP, no antihypertensives for now.  Continue ASA. ? ?4. [redacted] weeks gestation of pregnancy ?5. Supervision of high risk pregnancy in third trimester ?No other concerns. Preterm labor symptoms and general obstetric precautions including but not limited to vaginal bleeding, contractions, leaking of fluid and fetal movement were reviewed in detail with the patient. ?Please refer to After Visit Summary for other counseling recommendations.  ? ?Return in about 1 week (around 03/14/2022) for OFFICE OB VISIT (MD only), blood sugar review  - Can be virtual. ? ?Future Appointments  ?Date Time Provider [place]  ?03/07/2022 11:00 AM [facility] NST [facility] None  ?03/07/2022 11:30 AM [facility] US[facility]  ?03/13/2022  1:10 PM Caren Macadam, MD [facility]  ?03/14/2022  9:30 AM [facility] US1 [facility]  ?03/14/2022 10:00 AM [facility] NST [facility] None  ?03/15/2022  9:30 AM Georgina Peer, RD [facility] None  ?03/21/2022 10:35 AM Kerri Kovacik, Sallyanne Havers, MD [facility]  ?03/21/2022  2:00 PM [facility] NST [facility] None  ?03/21/2022  2:30 PM [facility] US[facility]  ?04/04/2022 10:55 AM Aletha Halim, MD [facility]  ?04/11/2022 10:15 AM Constant, Vickii Chafe, MD [facility]  ?04/18/2022 10:15 AM Aletha Halim, MD [facility]  ?04/25/2022 10:15 AM Aletha Halim, MD [facility]  [facility]  ? ? ?Verita Schneiders, MD ? ?

## 2022-03-09 ENCOUNTER — Other Ambulatory Visit: Payer: Self-pay

## 2022-03-09 ENCOUNTER — Other Ambulatory Visit: Payer: BC Managed Care – PPO

## 2022-03-09 DIAGNOSIS — O99340 Other mental disorders complicating pregnancy, unspecified trimester: Secondary | ICD-10-CM

## 2022-03-09 DIAGNOSIS — O365930 Maternal care for other known or suspected poor fetal growth, third trimester, not applicable or unspecified: Secondary | ICD-10-CM

## 2022-03-09 DIAGNOSIS — O24419 Gestational diabetes mellitus in pregnancy, unspecified control: Secondary | ICD-10-CM

## 2022-03-09 DIAGNOSIS — O24410 Gestational diabetes mellitus in pregnancy, diet controlled: Secondary | ICD-10-CM

## 2022-03-09 DIAGNOSIS — O99213 Obesity complicating pregnancy, third trimester: Secondary | ICD-10-CM

## 2022-03-09 DIAGNOSIS — O10919 Unspecified pre-existing hypertension complicating pregnancy, unspecified trimester: Secondary | ICD-10-CM

## 2022-03-13 ENCOUNTER — Encounter: Payer: Self-pay | Admitting: Family Medicine

## 2022-03-13 ENCOUNTER — Telehealth (INDEPENDENT_AMBULATORY_CARE_PROVIDER_SITE_OTHER): Payer: BC Managed Care – PPO | Admitting: Family Medicine

## 2022-03-13 DIAGNOSIS — O99210 Obesity complicating pregnancy, unspecified trimester: Secondary | ICD-10-CM

## 2022-03-13 DIAGNOSIS — O365930 Maternal care for other known or suspected poor fetal growth, third trimester, not applicable or unspecified: Secondary | ICD-10-CM

## 2022-03-13 DIAGNOSIS — O99340 Other mental disorders complicating pregnancy, unspecified trimester: Secondary | ICD-10-CM

## 2022-03-13 DIAGNOSIS — O24415 Gestational diabetes mellitus in pregnancy, controlled by oral hypoglycemic drugs: Secondary | ICD-10-CM

## 2022-03-13 DIAGNOSIS — O10919 Unspecified pre-existing hypertension complicating pregnancy, unspecified trimester: Secondary | ICD-10-CM

## 2022-03-13 DIAGNOSIS — Z3A33 33 weeks gestation of pregnancy: Secondary | ICD-10-CM

## 2022-03-13 DIAGNOSIS — O24419 Gestational diabetes mellitus in pregnancy, unspecified control: Secondary | ICD-10-CM

## 2022-03-13 DIAGNOSIS — O0993 Supervision of high risk pregnancy, unspecified, third trimester: Secondary | ICD-10-CM

## 2022-03-13 DIAGNOSIS — F32A Depression, unspecified: Secondary | ICD-10-CM

## 2022-03-13 MED ORDER — METFORMIN HCL 1000 MG PO TABS: 1000.0000 mg | ORAL_TABLET | Freq: Every day | ORAL | 3 refills | Status: DC

## 2022-03-13 NOTE — Progress Notes (Signed)
? ?PRENATAL VISIT NOTE ?I connected with Tina Dixon 03/13/22 at  1:10 PM EDT by: MyChart video and verified that I am speaking with the correct person using two identifiers.  Patient is located at home and provider is located at [facility].   ?  ?The purpose of this virtual visit is to provide medical care while limiting exposure to the novel coronavirus. I discussed the limitations, risks, security and privacy concerns of performing an evaluation and management service by MyChart video and the availability of in person appointments. I also discussed with the patient that there may be a patient responsible charge related to this service. By engaging in this virtual visit, you consent to the provision of healthcare.  Additionally, you authorize for your insurance to be billed for the services provided during this visit.  The patient expressed understanding and agreed to proceed. ? ?The following staff members participated in the virtual visit:  Keenan Bachelor CMA ? ?Subjective:  ?Tina Dixon is a 31 y.o. G1P0 at [redacted]w[redacted]d being seen today for ongoing prenatal care.  She is currently monitored for the following issues for this high-risk pregnancy and has Supervision of high-risk pregnancy; Maternal morbid obesity, antepartum ([place]); Depression affecting pregnancy; Chronic hypertension during pregnancy; Gestational diabetes mellitus, antepartum; and IUGR (intrauterine growth restriction) affecting care of mother, third trimester on their problem list. ? ?Patient reports no complaints.  Contractions: Not present. Vag. Bleeding: None.  Movement: Present. Denies leaking of fluid.  ? ?Fasting- three more recent 105, 100, 100 (used to be in the 110-115) ?After eating (checking 1-2 hrs after eating)-- 97-100 ?Triggers-- sugary/sweets at night,  ? ?The following portions of the patient's history were reviewed and updated as appropriate: allergies, current medications, past family history, past medical history, past social history,  past surgical history and problem list.  ? ?Objective:  ?There were no vitals filed for this visit. ? ?Fetal Status:     Movement: Present    ? ?General:  Alert, oriented and cooperative. Patient is in no acute distress.  ?Skin: Skin is warm and dry. No rash noted.   ?Cardiovascular: Normal heart rate noted  ?Respiratory: Normal respiratory effort, no problems with respiration noted  ?Abdomen: Soft, gravid, appropriate for gestational age.  Pain/Pressure: Absent     ?Pelvic: Cervical exam deferred        ?Extremities: Normal range of motion.  Edema: None  ?Mental Status: Normal mood and affect. Normal behavior. Normal judgment and thought content.  ? ?Assessment and Plan:  ?Pregnancy: G1P0 at [redacted]w[redacted]d ?1. Supervision of high risk pregnancy in third trimester ?Up to date currently ? ?2. Gestational diabetes mellitus (GDM) controlled on oral hypoglycemic drug, antepartum ?Overall improved but fastings are still slightly above range. Discussed increasing nighttime metformin dose ?Increase Metformin to 1000mg  QHS, Rx sent to pharmacy ?Antenatal testing scheduled with MFM ?IOL at 39 weeks ? ?3. Chronic hypertension during pregnancy ?BP WNL ?Cannot find cuff to monitor today ? ?4. Maternal morbid obesity, antepartum ([place]) ?TWG=-4 lb 8 oz (-2.041 kg)  ? ?5. IUGR (intrauterine growth restriction) affecting care of mother, third trimester ?Has growth [place] scheduled ? ?6. Depression affecting pregnancy ?Stable. No concerns currently on stable dose of Zoloft ? ?Preterm labor symptoms and general obstetric precautions including but not limited to vaginal bleeding, contractions, leaking of fluid and fetal movement were reviewed in detail with the patient. ?Please refer to After Visit Summary for other counseling recommendations.  ? ?No follow-ups on file. ? ?Future Appointments  ?Date Time  Provider Department Center  ?03/14/2022  9:00 AM [facility] US1 [facility]  ?03/14/2022 10:00 AM [facility] NST [facility] None  ?03/15/2022  9:30 AM  Georgina Peer, RD [facility] None  ?03/21/2022 10:35 AM Anyanwu, Sallyanne Havers, MD [facility]  ?03/21/2022  2:00 PM [facility] NST [facility] None  ?03/21/2022  2:30 PM [facility] US1 [facility]  ?03/28/2022  3:30 PM [facility] NST [facility] None  ?03/28/2022  4:00 PM [facility] US1 [facility]  ?04/04/2022 10:55 AM Aletha Halim, MD [facility]  ?04/11/2022 10:15 AM Constant, Vickii Chafe, MD [facility]  ?04/18/2022 10:15 AM Aletha Halim, MD [facility]  ?04/25/2022 10:15 AM Aletha Halim, MD [facility]  ? ? ?Caren Macadam, MD ?

## 2022-03-14 ENCOUNTER — Other Ambulatory Visit: Payer: Self-pay

## 2022-03-14 ENCOUNTER — Ambulatory Visit: Payer: BC Managed Care – PPO | Attending: Obstetrics and Gynecology

## 2022-03-14 ENCOUNTER — Ambulatory Visit (HOSPITAL_BASED_OUTPATIENT_CLINIC_OR_DEPARTMENT_OTHER): Payer: BC Managed Care – PPO | Admitting: Obstetrics and Gynecology

## 2022-03-14 VITALS — BP 120/72 | HR 101 | Temp 98.1°F | Ht 63.0 in | Wt 229.0 lb

## 2022-03-14 DIAGNOSIS — O24419 Gestational diabetes mellitus in pregnancy, unspecified control: Secondary | ICD-10-CM

## 2022-03-14 DIAGNOSIS — O24415 Gestational diabetes mellitus in pregnancy, controlled by oral hypoglycemic drugs: Secondary | ICD-10-CM | POA: Insufficient documentation

## 2022-03-14 DIAGNOSIS — O10013 Pre-existing essential hypertension complicating pregnancy, third trimester: Secondary | ICD-10-CM | POA: Insufficient documentation

## 2022-03-14 DIAGNOSIS — Z3A33 33 weeks gestation of pregnancy: Secondary | ICD-10-CM | POA: Diagnosis not present

## 2022-03-14 DIAGNOSIS — O99213 Obesity complicating pregnancy, third trimester: Secondary | ICD-10-CM | POA: Diagnosis not present

## 2022-03-14 DIAGNOSIS — F32A Depression, unspecified: Secondary | ICD-10-CM

## 2022-03-14 DIAGNOSIS — O99343 Other mental disorders complicating pregnancy, third trimester: Secondary | ICD-10-CM

## 2022-03-14 DIAGNOSIS — O365930 Maternal care for other known or suspected poor fetal growth, third trimester, not applicable or unspecified: Secondary | ICD-10-CM

## 2022-03-14 DIAGNOSIS — O0993 Supervision of high risk pregnancy, unspecified, third trimester: Secondary | ICD-10-CM

## 2022-03-14 DIAGNOSIS — O10919 Unspecified pre-existing hypertension complicating pregnancy, unspecified trimester: Secondary | ICD-10-CM

## 2022-03-14 DIAGNOSIS — O24410 Gestational diabetes mellitus in pregnancy, diet controlled: Secondary | ICD-10-CM

## 2022-03-14 DIAGNOSIS — E669 Obesity, unspecified: Secondary | ICD-10-CM

## 2022-03-14 NOTE — Procedures (Signed)
Tina Dixon Tina Dixon ?28-Oct-1991 ?[redacted]w[redacted]d ? ?Fetus A Non-Stress Test Interpretation for 03/14/22 ? ?Indication: IUGR ? ?Fetal Heart Rate A ?Mode: External ?Baseline Rate (A): 140 bpm ?Variability: Moderate ?Accelerations: 15 x 15 ?Decelerations: None ? ?Uterine Activity ?Mode: Toco ? ?Interpretation (Fetal Testing) ?Nonstress Test Interpretation: Reactive (Per Dr. Judeth Cornfield) ? ? ?

## 2022-03-14 NOTE — Procedures (Deleted)
Tina Dixon [name] ?1991-09-18 ?[redacted]w[redacted]d ? ?Fetus A Non-Stress Test Interpretation for 03/14/22 ? ?Indication: IUGR ? ?Fetal Heart Rate A ?Mode: External ?Baseline Rate (A): 140 bpm ?Variability: Moderate ?Accelerations: 10 x 10 ?Decelerations: None ? ?Uterine Activity ?Mode: Toco ? ?Interpretation (Fetal Testing) ?Nonstress Test Interpretation: Reactive (Per Dr. Judeth Cornfield) ? ? ?

## 2022-03-15 ENCOUNTER — Encounter: Payer: BC Managed Care – PPO | Attending: Obstetrics & Gynecology | Admitting: Dietician

## 2022-03-15 ENCOUNTER — Encounter: Payer: Self-pay | Admitting: Dietician

## 2022-03-15 VITALS — BP 106/70 | Ht 63.0 in | Wt 228.0 lb

## 2022-03-15 DIAGNOSIS — Z713 Dietary counseling and surveillance: Secondary | ICD-10-CM | POA: Diagnosis not present

## 2022-03-15 DIAGNOSIS — Z3A30 30 weeks gestation of pregnancy: Secondary | ICD-10-CM | POA: Insufficient documentation

## 2022-03-15 DIAGNOSIS — O24419 Gestational diabetes mellitus in pregnancy, unspecified control: Secondary | ICD-10-CM | POA: Diagnosis not present

## 2022-03-15 DIAGNOSIS — O24415 Gestational diabetes mellitus in pregnancy, controlled by oral hypoglycemic drugs: Secondary | ICD-10-CM

## 2022-03-15 NOTE — Progress Notes (Signed)
Patient's BG record indicates fasting BGs ranging 101-112, and post-meal BGs ranging 94-153 + one reading of 197 after eating dessert with dinner ?She reports some nausea with Metformin, particularly with recent increased dose. ?Patient's food recall indicates mostly balanced meals with protein source + controlled carb intake + veg, low fat choices.   ?Provided basic balanced meal plan, and wrote individualized menus based on patient's food preferences. ?Instructed patient on food safety, including avoidance of Listeriosis, and limiting mercury from fish. ?Discussed importance of maintaining healthy lifestyle habits to reduce risk of Type 2 DM as well as Gestational DM with any future pregnancies. ?Advised patient to use any remaining testing supplies to test some BGs after delivery, and to have BG tested ideally annually, as well as prior to attempting future pregnancies. ? ?

## 2022-03-15 NOTE — Patient Instructions (Signed)
Continue to eat balanced meals and control carb amounts with meals; great job! ? ?

## 2022-03-16 ENCOUNTER — Other Ambulatory Visit: Payer: Self-pay

## 2022-03-16 DIAGNOSIS — O24419 Gestational diabetes mellitus in pregnancy, unspecified control: Secondary | ICD-10-CM

## 2022-03-16 DIAGNOSIS — O10919 Unspecified pre-existing hypertension complicating pregnancy, unspecified trimester: Secondary | ICD-10-CM

## 2022-03-16 DIAGNOSIS — O99213 Obesity complicating pregnancy, third trimester: Secondary | ICD-10-CM

## 2022-03-16 DIAGNOSIS — O365930 Maternal care for other known or suspected poor fetal growth, third trimester, not applicable or unspecified: Secondary | ICD-10-CM

## 2022-03-16 DIAGNOSIS — O99340 Other mental disorders complicating pregnancy, unspecified trimester: Secondary | ICD-10-CM

## 2022-03-21 ENCOUNTER — Other Ambulatory Visit: Payer: BC Managed Care – PPO

## 2022-03-21 ENCOUNTER — Other Ambulatory Visit: Payer: Self-pay | Admitting: Obstetrics and Gynecology

## 2022-03-21 ENCOUNTER — Other Ambulatory Visit: Payer: Self-pay

## 2022-03-21 ENCOUNTER — Other Ambulatory Visit (HOSPITAL_COMMUNITY)
Admission: RE | Admit: 2022-03-21 | Discharge: 2022-03-21 | Disposition: A | Discharge status: Home / Self Care | Payer: BC Managed Care – PPO | Source: Ambulatory Visit | Attending: Obstetrics & Gynecology | Admitting: Obstetrics & Gynecology

## 2022-03-21 ENCOUNTER — Ambulatory Visit (INDEPENDENT_AMBULATORY_CARE_PROVIDER_SITE_OTHER): Payer: BC Managed Care – PPO | Admitting: Obstetrics & Gynecology

## 2022-03-21 ENCOUNTER — Encounter: Payer: Self-pay | Admitting: Obstetrics & Gynecology

## 2022-03-21 ENCOUNTER — Ambulatory Visit: Payer: BC Managed Care – PPO | Attending: Obstetrics and Gynecology

## 2022-03-21 ENCOUNTER — Ambulatory Visit (HOSPITAL_BASED_OUTPATIENT_CLINIC_OR_DEPARTMENT_OTHER): Payer: BC Managed Care – PPO | Admitting: Obstetrics and Gynecology

## 2022-03-21 VITALS — BP 119/86 | HR 103 | Wt 231.0 lb

## 2022-03-21 VITALS — BP 123/89 | HR 99 | Temp 97.4°F | Ht 63.0 in | Wt 232.5 lb

## 2022-03-21 DIAGNOSIS — O24415 Gestational diabetes mellitus in pregnancy, controlled by oral hypoglycemic drugs: Secondary | ICD-10-CM | POA: Diagnosis not present

## 2022-03-21 DIAGNOSIS — O365990 Maternal care for other known or suspected poor fetal growth, unspecified trimester, not applicable or unspecified: Secondary | ICD-10-CM | POA: Diagnosis not present

## 2022-03-21 DIAGNOSIS — O0993 Supervision of high risk pregnancy, unspecified, third trimester: Secondary | ICD-10-CM | POA: Diagnosis not present

## 2022-03-21 DIAGNOSIS — O365930 Maternal care for other known or suspected poor fetal growth, third trimester, not applicable or unspecified: Secondary | ICD-10-CM

## 2022-03-21 DIAGNOSIS — N760 Acute vaginitis: Secondary | ICD-10-CM | POA: Diagnosis not present

## 2022-03-21 DIAGNOSIS — O99210 Obesity complicating pregnancy, unspecified trimester: Secondary | ICD-10-CM

## 2022-03-21 DIAGNOSIS — O24419 Gestational diabetes mellitus in pregnancy, unspecified control: Secondary | ICD-10-CM

## 2022-03-21 DIAGNOSIS — O99213 Obesity complicating pregnancy, third trimester: Secondary | ICD-10-CM

## 2022-03-21 DIAGNOSIS — O23593 Infection of other part of genital tract in pregnancy, third trimester: Secondary | ICD-10-CM

## 2022-03-21 DIAGNOSIS — Z3A34 34 weeks gestation of pregnancy: Secondary | ICD-10-CM | POA: Diagnosis not present

## 2022-03-21 DIAGNOSIS — O10919 Unspecified pre-existing hypertension complicating pregnancy, unspecified trimester: Secondary | ICD-10-CM

## 2022-03-21 DIAGNOSIS — B3731 Acute candidiasis of vulva and vagina: Secondary | ICD-10-CM | POA: Insufficient documentation

## 2022-03-21 DIAGNOSIS — O99340 Other mental disorders complicating pregnancy, unspecified trimester: Secondary | ICD-10-CM | POA: Insufficient documentation

## 2022-03-21 DIAGNOSIS — Z7984 Long term (current) use of oral hypoglycemic drugs: Secondary | ICD-10-CM | POA: Insufficient documentation

## 2022-03-21 DIAGNOSIS — O10019 Pre-existing essential hypertension complicating pregnancy, unspecified trimester: Secondary | ICD-10-CM | POA: Diagnosis not present

## 2022-03-21 DIAGNOSIS — O99343 Other mental disorders complicating pregnancy, third trimester: Secondary | ICD-10-CM

## 2022-03-21 DIAGNOSIS — O10913 Unspecified pre-existing hypertension complicating pregnancy, third trimester: Secondary | ICD-10-CM

## 2022-03-21 DIAGNOSIS — E669 Obesity, unspecified: Secondary | ICD-10-CM

## 2022-03-21 DIAGNOSIS — F32A Depression, unspecified: Secondary | ICD-10-CM | POA: Insufficient documentation

## 2022-03-21 NOTE — Procedures (Signed)
Tina Dixon ?[birth year] ?[redacted]w[redacted]d ? ?Fetus A Non-Stress Test Interpretation for 03/21/22 ? ?Indication: IUGR ? ?Fetal Heart Rate A ?Mode: External ?Baseline Rate (A): 140 bpm ?Variability: Moderate ?Accelerations: 15 x 15 ?Decelerations: None ?Multiple birth?: No ? ?Uterine Activity ?Mode: Toco ? ?Interpretation (Fetal Testing) ?Nonstress Test Interpretation: Reactive (Per Dr. Judeth Cornfield) ? ? ?

## 2022-03-21 NOTE — Patient Instructions (Signed)
Return to office for any scheduled appointments. Call the office or go to the MA[facility] at [facility] [place] if: You begin to have strong, frequent contractions Your water breaks.  Sometimes it is a big gush of fluid, sometimes it is just a trickle that keeps getting your underwear wet or running down your legs You have vaginal bleeding.  It is normal to have a small amount of spotting if your cervix was checked.  You do not feel your baby moving like normal.  If you do not, get something to eat and drink and lay down and focus on feeling your baby move.   If your baby is still not moving like normal, you should call the office or go to MAU. Any other obstetric concerns.  

## 2022-03-21 NOTE — Progress Notes (Signed)
? ?PRENATAL VISIT NOTE ? ?Subjective:  ?Tina Dixon is a 31 y.o. G1P0 at [redacted]w[redacted]d being seen today for ongoing prenatal care.  She is currently monitored for the following issues for this high-risk pregnancy and has Supervision of high-risk pregnancy; Maternal morbid obesity, antepartum ([place]); Depression affecting pregnancy; Chronic hypertension during pregnancy; Gestational diabetes mellitus, antepartum; and IUGR (intrauterine growth restriction) affecting care of mother, third trimester on their problem list. ? ?Patient reports intermittent vaginal irritation over the last few weeks.  Contractions: Not present. Vag. Bleeding: None.  Movement: Present. Denies leaking of fluid.  ? ?The following portions of the patient's history were reviewed and updated as appropriate: allergies, current medications, past family history, past medical history, past social history, past surgical history and problem list.  ? ?Objective:  ? ?Vitals:  ? 03/21/22 1052  ?BP: 119/86  ?Pulse: (!) 103  ?Weight: 231 lb (104.8 kg)  ? ? ?Fetal Status: Fetal Heart Rate (bpm): 152   Movement: Present    ? ?General:  Alert, oriented and cooperative. Patient is in no acute distress.  ?Skin: Skin is warm and dry. No rash noted.   ?Cardiovascular: Normal heart rate noted  ?Respiratory: Normal respiratory effort, no problems with respiration noted  ?Abdomen: Soft, gravid, appropriate for gestational age.  Pain/Pressure: Present     ?Pelvic: Cervical exam deferred       Pelvic cultures done in the presence of chaperone, patient had thick, yellow discharge.  ?Extremities: Normal range of motion.  Edema: None  ?Mental Status: Normal mood and affect. Normal behavior. Normal judgment and thought content.  ? ?Imaging: ?[place] MFM FETAL BPP W/NONSTRESS ? ?Result Date: 03/14/2022 ?----------------------------------------------------------------------  OBSTETRICS REPORT                       (Signed Final 03/14/2022 10:25 am)  ---------------------------------------------------------------------- Patient Info  ID #:       [id number]                          D.O.B.:  1991/07/05 (31 yrs)  Name:       Tina Dixon                    Visit Date: 03/14/2022 09:33 am ---------------------------------------------------------------------- Performed By  Attending:        Tama High MD        Ref. Address:     [place]  Performed By:     Eveline Keto         [facility]                                     [facility]  Referred By:      [facility] ---------------------------------------------------------------------- Orders  #  Description                           Code        Ordered By  1  [place] MFM FETAL BPP                      [id number]     [facility]     W/NONSTRESS  2  [place] MFM UA CORD DOPPLER                [id number]    [facility] ----------------------------------------------------------------------  #  Order #                     Accession #                Episode #  1  [id number]                   [id number]                 [id number]  2  [id number]                   [id number]                 [id number] ---------------------------------------------------------------------- Indications  [redacted] weeks gestation of pregnancy                Z3A.33  Maternal care for known or suspected poor      O36.5931  fetal growth, third trimester, fetus 1 IUGR  Gestational diabetes in pregnancy,             O24.415  controlled by oral hypoglycemic drugs  Hypertension - Chronic/Pre-existing (no        O10.019  meds)  Obesity complicating pregnancy, third          O99.213  trimester (BMI 38)  Other mental disorder complicating             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 Cord Insertion:      Previously Visualized  Amniotic Fluid  AFI FV:      Within normal limits  AFI Sum(cm)     %Tile       Largest Pocket(cm)  11.6            31          4.8  RUQ(cm)       RLQ(cm)       LUQ(cm)        LLQ(cm)  0             3.9           4.8            2.9 ---------------------------------------------------------------------- Biophysical Evaluation  Amniotic F.V:   Within normal limits       F. Tone:        Observed  F.  Movement:    Observed                   N.S.T:          Reactive  F. Breathing:   Observed                   Score:          10/10 ---------------------------------------------------------------------- Biometry  LV:        4.1  mm ---------------------------------------------------------------------- OB History  Gravidity:    1         Term:   0        Prem:   0        SAB:   0  TOP:          0       Ectopic:  0        Living: 0 ---------------------------------------------------------------------- Gestational Age  LMP:           33w 5d        Date:  07/21/21                 EDD:   04/27/22  Best:          33w 5d     Det. By:  LMP  (07/21/21)          EDD:   04/27/22 ---------------------------------------------------------------------- Anatomy  Cranium:               Appears normal         Diaphragm:              Appears normal  Cavum:                 Appears normal         Stomach:                Appears normal, left                                                                        sided  Ventricles:            Appears normal         Kidneys:                Appear normal  Heart:                 Appears normal         Bladder:                Appears normal                         (4CH, axis, and                         situs)  ---------------------------------------------------------------------- Doppler - Fetal Vessels  Umbilical Artery   S/D     %tile      RI    %tile      PI    %tile   2.63       55    0.62       61  0.94       64 ---------------------------------------------------------------------- Cervix Uterus Adnexa  Cervix  Not visualized (advanced GA >24wks)  Uterus  No abnormality visualized.  Right Ovary  Not visualized.  Left Ovary  Not visualized. ---------------------------------------------------------------------- Impression  Fetal growth restriction.  On ultrasound performed 2 weeks  ago, the estimated fetal weight was at the 3rd percentile.  Patient has gestational diabetes and takes metformin 1000  mg at night.  Blood pressure today at her office is 120/72 mmHg.  Amniotic fluid is normal and good fetal activity is seen .  Umbilical artery Doppler showed normal forward diastolic  flow.  NST is reactive.  BPP 10/10.  I reassured the patient of the findings. ---------------------------------------------------------------------- Recommendations  - Fetal growth assessment next week.  -Continue weekly antenatal testing till delivery.  -We will recommend timing of delivery next week. ----------------------------------------------------------------------                  Tama High, MD Electronically Signed Final Report   03/14/2022 10:25 am ---------------------------------------------------------------------- ? ?[place] MFM FETAL BPP W/NONSTRESS ? ?Result Date: 03/07/2022 ?----------------------------------------------------------------------  OBSTETRICS REPORT                        (Signed Final 03/07/2022 03:49 pm) ---------------------------------------------------------------------- Patient Info  ID #:       [id number]                          D.O.B.:  12-Feb-1991 (31 yrs)  Name:       Tina Dixon                    Visit Date: 03/07/2022 12:30 pm ----------------------------------------------------------------------  Performed By  Attending:        Sander Nephew      Ref. Address:      [place]  Performed By:     Eveline Keto         Location:          [facility]                    RD[facility]

## 2022-03-22 LAB — CERVICOVAGINAL ANCILLARY ONLY
Bacterial Vaginitis (gardnerella): POSITIVE — AB
Candida Glabrata: NEGATIVE
Candida Vaginitis: POSITIVE — AB
Chlamydia: NEGATIVE
Comment: NEGATIVE
Comment: NEGATIVE
Comment: NEGATIVE
Comment: NEGATIVE
Comment: NEGATIVE
Comment: NORMAL
Neisseria Gonorrhea: NEGATIVE
Trichomonas: NEGATIVE

## 2022-03-22 MED ORDER — METRONIDAZOLE 500 MG PO TABS: 500.0000 mg | ORAL_TABLET | Freq: Two times a day (BID) | ORAL | 0 refills | Status: AC

## 2022-03-22 MED ORDER — TERCONAZOLE 0.8 % VA CREA: 1.0000 | TOPICAL_CREAM | Freq: Every day | VAGINAL | 0 refills | Status: DC

## 2022-03-22 NOTE — Addendum Note (Signed)
Addended by: Verita Schneiders A on: 03/22/2022 03:27 PM ? ? Modules accepted: Orders ? ?

## 2022-03-23 ENCOUNTER — Other Ambulatory Visit: Payer: Self-pay

## 2022-03-23 ENCOUNTER — Encounter: Payer: Self-pay | Admitting: Obstetrics & Gynecology

## 2022-03-23 DIAGNOSIS — O10919 Unspecified pre-existing hypertension complicating pregnancy, unspecified trimester: Secondary | ICD-10-CM

## 2022-03-23 DIAGNOSIS — O24419 Gestational diabetes mellitus in pregnancy, unspecified control: Secondary | ICD-10-CM

## 2022-03-23 DIAGNOSIS — O99820 Streptococcus B carrier state complicating pregnancy: Secondary | ICD-10-CM | POA: Insufficient documentation

## 2022-03-23 DIAGNOSIS — O365930 Maternal care for other known or suspected poor fetal growth, third trimester, not applicable or unspecified: Secondary | ICD-10-CM

## 2022-03-23 LAB — STREP GP B NAA: Strep Gp B NAA: POSITIVE — AB

## 2022-03-28 ENCOUNTER — Telehealth (HOSPITAL_COMMUNITY): Payer: Self-pay | Admitting: *Deleted

## 2022-03-28 ENCOUNTER — Other Ambulatory Visit: Payer: Self-pay

## 2022-03-28 ENCOUNTER — Other Ambulatory Visit: Payer: Self-pay | Admitting: Maternal & Fetal Medicine

## 2022-03-28 ENCOUNTER — Ambulatory Visit: Payer: BC Managed Care – PPO | Attending: Maternal & Fetal Medicine

## 2022-03-28 ENCOUNTER — Encounter (HOSPITAL_COMMUNITY): Payer: Self-pay

## 2022-03-28 ENCOUNTER — Ambulatory Visit (INDEPENDENT_AMBULATORY_CARE_PROVIDER_SITE_OTHER): Payer: BC Managed Care – PPO | Admitting: Obstetrics & Gynecology

## 2022-03-28 ENCOUNTER — Ambulatory Visit (HOSPITAL_BASED_OUTPATIENT_CLINIC_OR_DEPARTMENT_OTHER): Payer: BC Managed Care – PPO | Admitting: Maternal & Fetal Medicine

## 2022-03-28 VITALS — BP 133/89 | HR 98 | Wt 231.0 lb

## 2022-03-28 VITALS — BP 129/82 | HR 92 | Temp 97.6°F | Ht 63.0 in | Wt 231.0 lb

## 2022-03-28 DIAGNOSIS — O10013 Pre-existing essential hypertension complicating pregnancy, third trimester: Secondary | ICD-10-CM | POA: Diagnosis not present

## 2022-03-28 DIAGNOSIS — O99210 Obesity complicating pregnancy, unspecified trimester: Secondary | ICD-10-CM | POA: Diagnosis not present

## 2022-03-28 DIAGNOSIS — Z3A35 35 weeks gestation of pregnancy: Secondary | ICD-10-CM | POA: Diagnosis not present

## 2022-03-28 DIAGNOSIS — O365930 Maternal care for other known or suspected poor fetal growth, third trimester, not applicable or unspecified: Secondary | ICD-10-CM

## 2022-03-28 DIAGNOSIS — O24415 Gestational diabetes mellitus in pregnancy, controlled by oral hypoglycemic drugs: Secondary | ICD-10-CM | POA: Diagnosis not present

## 2022-03-28 DIAGNOSIS — O10919 Unspecified pre-existing hypertension complicating pregnancy, unspecified trimester: Secondary | ICD-10-CM

## 2022-03-28 DIAGNOSIS — O24419 Gestational diabetes mellitus in pregnancy, unspecified control: Secondary | ICD-10-CM

## 2022-03-28 DIAGNOSIS — O0993 Supervision of high risk pregnancy, unspecified, third trimester: Secondary | ICD-10-CM

## 2022-03-28 DIAGNOSIS — O99213 Obesity complicating pregnancy, third trimester: Secondary | ICD-10-CM

## 2022-03-28 DIAGNOSIS — O99343 Other mental disorders complicating pregnancy, third trimester: Secondary | ICD-10-CM

## 2022-03-28 NOTE — Patient Instructions (Signed)
Induction of labor is 04/07/22 at midnight, come to hospital at 11:45 pm on 04/06/22 ? ?Return to office for any scheduled appointments. Call the office or go to the [facility] at [facility] if: ?You begin to have strong, frequent contractions ?Your water breaks.  Sometimes it is a big gush of fluid, sometimes it is just a trickle that keeps getting your underwear wet or running down your legs ?You have vaginal bleeding.  It is normal to have a small amount of spotting if your cervix was checked.  ?You do not feel your baby moving like normal.  If you do not, get something to eat and drink and lay down and focus on feeling your baby move.   If your baby is still not moving like normal, you should call the office or go to MAU. ?Any other obstetric concerns. ? ?

## 2022-03-28 NOTE — Telephone Encounter (Signed)
Preadmission screen  

## 2022-03-28 NOTE — Procedures (Signed)
Tina Dixon 03/16/1991 [redacted]w[redacted]d  Fetus A Non-Stress Test Interpretation for 03/28/22  Indication: IUGR  Fetal Heart Rate A Mode: External Baseline Rate (A): 145 bpm Variability: Moderate Accelerations: 15 x 15 Decelerations: None Multiple birth?: No  Uterine Activity Mode: Toco  Interpretation (Fetal Testing) Nonstress Test Interpretation: Reactive (Per Dr. Grace Bushy)

## 2022-03-28 NOTE — Progress Notes (Signed)
? ?PRENATAL VISIT NOTE ? ?Subjective:  ?Tina Dixon is a 31 y.o. G1P0 at [redacted]w[redacted]d being seen today for ongoing prenatal care.  She is currently monitored for the following issues for this high-risk pregnancy and has Supervision of high-risk pregnancy; Maternal morbid obesity, antepartum ([place]); Depression affecting pregnancy; Chronic hypertension during pregnancy; Gestational diabetes mellitus, antepartum; Severe IUGR (intrauterine growth restriction) affecting care of mother, third trimester; and Group B Streptococcus carrier, +RV culture, currently pregnant on their problem list. ? ?Patient reports no complaints.  Contractions: Not present.  .  Movement: Present. Denies leaking of fluid.  ? ?The following portions of the patient's history were reviewed and updated as appropriate: allergies, current medications, past family history, past medical history, past social history, past surgical history and problem list.  ? ?Objective:  ? ?Vitals:  ? 03/28/22 0950  ?BP: 133/89  ?Pulse: 98  ?Weight: 231 lb (104.8 kg)  ? ? ?Fetal Status: Fetal Heart Rate (bpm): 169   Movement: Present    ? ?General:  Alert, oriented and cooperative. Patient is in no acute distress.  ?Skin: Skin is warm and dry. No rash noted.   ?Cardiovascular: Normal heart rate noted  ?Respiratory: Normal respiratory effort, no problems with respiration noted  ?Abdomen: Soft, gravid, appropriate for gestational age.  Pain/Pressure: Absent     ?Pelvic: Cervical exam deferred        ?Extremities: Normal range of motion.  Edema: None  ?Mental Status: Normal mood and affect. Normal behavior. Normal judgment and thought content.  ? ?Imaging: ?[place] MFM FETAL BPP W/NONSTRESS ? ?Result Date: 03/21/2022 ?----------------------------------------------------------------------  OBSTETRICS REPORT                       (Signed Final 03/21/2022 04:32 pm) ---------------------------------------------------------------------- Patient Info  ID #:       [id number]                           D.O.B.:  October 19, 1991 (31 yrs)  Name:       Tina Dixon                    Visit Date: 03/21/2022 03:01 pm ---------------------------------------------------------------------- Performed By  Attending:        Tama High MD        Ref. Address:     [place]  Performed By:     Eveline Keto         [facility]                                     [facility]  Referred By:      [facility] ---------------------------------------------------------------------- Orders  #  Description                           Code        Ordered By  1  [place] MFM FETAL BPP                      [id number]     RAVI SHANKAR     W/NONSTRESS  2  [place] MFM OB FOLLOW UP                   [id number]    RAVI SHANKAR  3  [place] MFM UA CORD DOPPLER                [id number]    RAVI [facility] ----------------------------------------------------------------------  #  Order #                     Accession #                Episode #  1  [id number]                   [id number]  2  [id number]                   [id number]  3  [id number]                   [id number]                 [id number] ---------------------------------------------------------------------- Indications  [redacted] weeks gestation of pregnancy                Z3A.34  Maternal care for known or suspected poor      O36.5931  fetal growth, third trimester, fetus 1 IUGR  Gestational diabetes in pregnancy,             O24.415  controlled by oral hypoglycemic drugs  Hypertension - Chronic/Pre-existing (no        O10.019  meds)  Obesity complicating pregnancy, third          O99.213  trimester (BMI 38)  Other mental disorder complicating             O99.340  pregnancy, third trimester (zoloft)  LR NIPS, Neg AFP  ---------------------------------------------------------------------- Fetal Evaluation  Num Of Fetuses:         1  Fetal Heart Rate(bpm):  137  Cardiac Activity:       Observed  Presentation:           Cephalic  Placenta:               Anterior  P. Cord Insertion:      Previously Visualized  Amniotic Fluid  AFI FV:      Within normal limits  AFI Sum(cm)     %Tile       Largest Pocket(cm)  9               13          3.6  RUQ(cm)       RLQ(cm)       LUQ(cm)  LLQ(cm)  0             1.9           3.6            3.5 ---------------------------------------------------------------------- Biophysical Evaluation  Amniotic F.V:   Within normal limits       F. Tone:        Observed  F. Movement:    Observed                   N.S.T:          Reactive  F. Breathing:   Observed                   Score:          10/10 ---------------------------------------------------------------------- Biometry  BPD:      83.2  mm     G. Age:  33w 3d         17  %    CI:        76.22   %    70 - 86                                                          FL/HC:      20.8   %    20.1 - 22.3  HC:       302   mm     G. Age:  33w 4d        3.5  %    HC/AC:      1.13        0.93 - 1.11  AC:      266.9  mm     G. Age:  30w 6d        < 1  %    FL/BPD:     75.5   %    71 - 87  FL:       62.8  mm     G. Age:  32w 4d        3.7  %    FL/AC:      23.5   %    20 - 24  Est. FW:    1844  gm      4 lb 1 oz    1.6  % ---------------------------------------------------------------------- OB History  Gravidity:    1         Term:   0        Prem:   0        SAB:   0  TOP:          0       Ectopic:  0        Living: 0 ---------------------------------------------------------------------- Gestational Age  LMP:           34w 5d        Date:  07/21/21                 EDD:   04/27/22  U/S Today:     32w 4d  EDD:   05/12/22  Best:          34w 5d     Det. By:  LMP  (07/21/21)          EDD:   04/27/22  ---------------------------------------------------------------------- Anatomy  Cranium:               Appears normal         LVOT:                   Appears normal  Cavum:                 Appears normal         Diaphragm:              Appears normal  Ventricles:            Previously seen        Stomach:                Appears normal, left                                                                        sided  Heart:                 Appears normal         Kidneys:                Appear normal                         (4CH, axis, and                         situs)  RVOT:                  Appears normal         Bladder:                Appears normal ---------------------------------------------------------------------- Doppler - Fetal Vessels  Umbilical Artery   S/D     %tile      RI    %tile      PI    %tile            ADFV    RDFV   3.44       92    0.71       93  51.82    > 97.5              NO       NO ---------------------------------------------------------------------- Cervix Uterus Adnexa  Cervix  Not visualized (advanced GA >24wks)  Uterus  No abnormality visualized.  Right Ovary  Not visualized.  Left Ovary  Not visualized. ---------------------------------------------------------------------- Impression  Severe fetal growth restriction.  Patient will return for fetal growth assessment and antenatal  testing.  Gestational diabetes.  Patient takes metformin 1000 mg at  bedtime and reports her blood glucose levels are within  normal range.  Blood pressure today at her office is 123/89  mmHg.  On today's ultrasound, the estimated fetal weight is at the 2nd  percentile and the abdominal circumference is at the 1st  percentile.  Interval weight gain over 3 weeks is 413 g.  Amniotic fluid is normal and good fetal activity seen.  Cephalic presentation.  Umbilical artery Doppler showed  normal forward diastolic flow.  NST is reactive.  BPP 10/10.  I reassured the patient of normal antenatal testing.  Because   of severe fetal growth restriction, I recommend delivery at [redacted]  weeks gestation. ---------------------------------------------------------------------- Recommendations  - Continue weekly antenatal testing till delivery. ------------------------------------------------------

## 2022-03-29 ENCOUNTER — Encounter (HOSPITAL_COMMUNITY): Payer: Self-pay | Admitting: *Deleted

## 2022-03-29 ENCOUNTER — Telehealth (HOSPITAL_COMMUNITY): Payer: Self-pay | Admitting: *Deleted

## 2022-03-29 NOTE — Telephone Encounter (Signed)
Preadmission screen  

## 2022-04-04 ENCOUNTER — Ambulatory Visit (HOSPITAL_BASED_OUTPATIENT_CLINIC_OR_DEPARTMENT_OTHER): Payer: BC Managed Care – PPO

## 2022-04-04 ENCOUNTER — Ambulatory Visit: Payer: BC Managed Care – PPO | Attending: Maternal & Fetal Medicine | Admitting: Maternal & Fetal Medicine

## 2022-04-04 ENCOUNTER — Other Ambulatory Visit: Payer: Self-pay | Admitting: Women's Health

## 2022-04-04 ENCOUNTER — Ambulatory Visit (INDEPENDENT_AMBULATORY_CARE_PROVIDER_SITE_OTHER): Payer: BC Managed Care – PPO | Admitting: Obstetrics and Gynecology

## 2022-04-04 ENCOUNTER — Other Ambulatory Visit: Payer: BC Managed Care – PPO

## 2022-04-04 ENCOUNTER — Other Ambulatory Visit: Payer: Self-pay

## 2022-04-04 ENCOUNTER — Other Ambulatory Visit: Payer: Self-pay | Admitting: Maternal & Fetal Medicine

## 2022-04-04 VITALS — BP 132/92 | HR 97 | Wt 232.0 lb

## 2022-04-04 DIAGNOSIS — O99343 Other mental disorders complicating pregnancy, third trimester: Secondary | ICD-10-CM

## 2022-04-04 DIAGNOSIS — O24419 Gestational diabetes mellitus in pregnancy, unspecified control: Secondary | ICD-10-CM

## 2022-04-04 DIAGNOSIS — O99210 Obesity complicating pregnancy, unspecified trimester: Secondary | ICD-10-CM

## 2022-04-04 DIAGNOSIS — Z79899 Other long term (current) drug therapy: Secondary | ICD-10-CM | POA: Insufficient documentation

## 2022-04-04 DIAGNOSIS — O24415 Gestational diabetes mellitus in pregnancy, controlled by oral hypoglycemic drugs: Secondary | ICD-10-CM | POA: Diagnosis not present

## 2022-04-04 DIAGNOSIS — O99820 Streptococcus B carrier state complicating pregnancy: Secondary | ICD-10-CM

## 2022-04-04 DIAGNOSIS — O0993 Supervision of high risk pregnancy, unspecified, third trimester: Secondary | ICD-10-CM

## 2022-04-04 DIAGNOSIS — Z3A36 36 weeks gestation of pregnancy: Secondary | ICD-10-CM | POA: Diagnosis not present

## 2022-04-04 DIAGNOSIS — O365930 Maternal care for other known or suspected poor fetal growth, third trimester, not applicable or unspecified: Secondary | ICD-10-CM

## 2022-04-04 DIAGNOSIS — F32A Depression, unspecified: Secondary | ICD-10-CM

## 2022-04-04 DIAGNOSIS — O10919 Unspecified pre-existing hypertension complicating pregnancy, unspecified trimester: Secondary | ICD-10-CM | POA: Diagnosis not present

## 2022-04-04 DIAGNOSIS — O99213 Obesity complicating pregnancy, third trimester: Secondary | ICD-10-CM

## 2022-04-04 DIAGNOSIS — O99340 Other mental disorders complicating pregnancy, unspecified trimester: Secondary | ICD-10-CM

## 2022-04-04 DIAGNOSIS — Z6841 Body Mass Index (BMI) 40.0 and over, adult: Secondary | ICD-10-CM

## 2022-04-04 DIAGNOSIS — O10013 Pre-existing essential hypertension complicating pregnancy, third trimester: Secondary | ICD-10-CM

## 2022-04-04 NOTE — Progress Notes (Addendum)
   PRENATAL VISIT NOTE  Subjective:  Tina Dixon is a 31 y.o. G1P0 at [redacted]w[redacted]d being seen today for ongoing prenatal care.  She is currently monitored for the following issues for this high-risk pregnancy and has Supervision of high-risk pregnancy; Maternal morbid obesity, antepartum ([facility]); Depression affecting pregnancy; Chronic hypertension during pregnancy; Gestational diabetes mellitus, antepartum; Severe IUGR (intrauterine growth restriction) affecting care of mother, third trimester; and Group B Streptococcus carrier, +RV culture, currently pregnant on their problem list.  Patient reports  no s/s of labor or pre-eclampsia .  Contractions: Irritability. Vag. Bleeding: None.  Movement: Present. Denies leaking of fluid.   The following portions of the patient's history were reviewed and updated as appropriate: allergies, current medications, past family history, past medical history, past social history, past surgical history and problem list.   Objective:   Vitals:   04/04/22 1104 04/04/22 1105  BP: (!) 141/98 (!) 132/92  Pulse: 99 97  Weight: 232 lb (105.2 kg)     Fetal Status:     Movement: Present     General:  Alert, oriented and cooperative. Patient is in no acute distress.  Skin: Skin is warm and dry. No rash noted.   Cardiovascular: Normal heart rate noted  Respiratory: Normal respiratory effort, no problems with respiration noted  Abdomen: Soft, gravid, appropriate for gestational age.  Pain/Pressure: Absent     Pelvic: Cervical exam deferred        Extremities: Normal range of motion.  Edema: None  Mental Status: Normal mood and affect. Normal behavior. Normal judgment and thought content.   Assessment and Plan:  Pregnancy: G1P0 at [redacted]w[redacted]d 1. Chronic hypertension during pregnancy Pt only on low dose asa. BPs up a little from baseline. Will check labs. Pt has IOL for 5/25 @ 2345. Precautions given - Protein / creatinine ratio, urine - CBC - Comprehensive metabolic  panel  2. [redacted] weeks gestation of pregnancy  3. Depression affecting pregnancy Doing well on zoloft  4. Group B Streptococcus carrier, +RV culture, currently pregnant  5. Maternal morbid obesity, antepartum ([facility])  6. BMI 40.0-44.9, adult ([facility])  7. Severe IUGR (intrauterine growth restriction) affecting care of mother, third trimester See above. 5/16: afi 9.4, bpp 10/10, ua dopplers normal. 5/9: efw 1.6%, 1844gm, ac <1%  8. Supervision of high risk pregnancy in third trimester  9. Gestational diabetes mellitus (GDM) controlled on oral hypoglycemic drug, antepartum On metformin 1000mg  po qhs. Normal CBG #s  Preterm labor symptoms and general obstetric precautions including but not limited to vaginal bleeding, contractions, leaking of fluid and fetal movement were reviewed in detail with the patient. Please refer to After Visit Summary for other counseling recommendations.   Return in about 1 week (around 04/11/2022) for in person, md or app.  Future Appointments  Date Time Provider Department Center  04/04/2022  2:30 PM [facility] US1 [facility]  04/04/2022  3:00 PM [facility] NST [facility] None  04/07/2022 12:00 AM [facility] SCHED ROOM [facility] None    04/09/2022, MD

## 2022-04-04 NOTE — Progress Notes (Signed)
ROB 36.5 wks BP Elevated Denies HA, visual changes, dizziness. Reports increased heartburn.

## 2022-04-04 NOTE — Procedures (Signed)
Akylah Hascall August 17, 1991 [redacted]w[redacted]d  Fetus A Non-Stress Test Interpretation for 04/04/22  Indication: IUGR  Fetal Heart Rate A Mode: External Baseline Rate (A): 140 bpm Variability: Moderate Accelerations: 15 x 15 Decelerations: None Multiple birth?: No  Uterine Activity Mode: Toco  Interpretation (Fetal Testing) Nonstress Test Interpretation: Reactive (Per Dr. Grace Bushy)

## 2022-04-05 LAB — COMPREHENSIVE METABOLIC PANEL
ALT: 11 IU/L (ref 0–32)
AST: 12 IU/L (ref 0–40)
Albumin/Globulin Ratio: 1.1 — ABNORMAL LOW (ref 1.2–2.2)
Albumin: 3.5 g/dL — ABNORMAL LOW (ref 3.9–5.0)
Alkaline Phosphatase: 151 IU/L — ABNORMAL HIGH (ref 44–121)
BUN/Creatinine Ratio: 12 (ref 9–23)
BUN: 6 mg/dL (ref 6–20)
Bilirubin Total: 0.3 mg/dL (ref 0.0–1.2)
CO2: 20 mmol/L (ref 20–29)
Calcium: 8.5 mg/dL — ABNORMAL LOW (ref 8.7–10.2)
Chloride: 103 mmol/L (ref 96–106)
Creatinine, Ser: 0.49 mg/dL — ABNORMAL LOW (ref 0.57–1.00)
Globulin, Total: 3.1 g/dL (ref 1.5–4.5)
Glucose: 117 mg/dL — ABNORMAL HIGH (ref 70–99)
Potassium: 4.2 mmol/L (ref 3.5–5.2)
Sodium: 138 mmol/L (ref 134–144)
Total Protein: 6.6 g/dL (ref 6.0–8.5)
eGFR: 130 mL/min/{1.73_m2} (ref >59)

## 2022-04-05 LAB — CBC
Hematocrit: 37.5 % (ref 34.0–46.6)
Hemoglobin: 12.2 g/dL (ref 11.1–15.9)
MCH: 28.1 pg (ref 26.6–33.0)
MCHC: 32.5 g/dL (ref 31.5–35.7)
MCV: 86 fL (ref 79–97)
Platelets: 347 10*3/uL (ref 150–450)
RBC: 4.34 x10E6/uL (ref 3.77–5.28)
RDW: 12.1 % (ref 11.7–15.4)
WBC: 10.9 10*3/uL — ABNORMAL HIGH (ref 3.4–10.8)

## 2022-04-05 LAB — PROTEIN / CREATININE RATIO, URINE
Creatinine, Urine: 100.4 mg/dL
Protein, Ur: 19.2 mg/dL
Protein/Creat Ratio: 191 mg/g creat (ref 0–200)

## 2022-04-06 ENCOUNTER — Other Ambulatory Visit: Payer: Self-pay

## 2022-04-07 ENCOUNTER — Inpatient Hospital Stay (HOSPITAL_COMMUNITY): Payer: BC Managed Care – PPO

## 2022-04-07 ENCOUNTER — Inpatient Hospital Stay (HOSPITAL_COMMUNITY): Payer: BC Managed Care – PPO | Admitting: Anesthesiology

## 2022-04-07 ENCOUNTER — Encounter (HOSPITAL_COMMUNITY): Payer: Self-pay | Admitting: Obstetrics & Gynecology

## 2022-04-07 ENCOUNTER — Inpatient Hospital Stay (HOSPITAL_COMMUNITY)
Admission: AD | Admit: 2022-04-07 | Discharge: 2022-04-09 | DRG: 806 | Disposition: A | Discharge status: Home / Self Care | Payer: BC Managed Care – PPO | Attending: Obstetrics and Gynecology | Admitting: Obstetrics and Gynecology

## 2022-04-07 DIAGNOSIS — O24425 Gestational diabetes mellitus in childbirth, controlled by oral hypoglycemic drugs: Secondary | ICD-10-CM | POA: Diagnosis not present

## 2022-04-07 DIAGNOSIS — O99820 Streptococcus B carrier state complicating pregnancy: Secondary | ICD-10-CM

## 2022-04-07 DIAGNOSIS — O365930 Maternal care for other known or suspected poor fetal growth, third trimester, not applicable or unspecified: Secondary | ICD-10-CM | POA: Diagnosis not present

## 2022-04-07 DIAGNOSIS — O0990 Supervision of high risk pregnancy, unspecified, unspecified trimester: Secondary | ICD-10-CM

## 2022-04-07 DIAGNOSIS — O24429 Gestational diabetes mellitus in childbirth, unspecified control: Secondary | ICD-10-CM | POA: Diagnosis not present

## 2022-04-07 DIAGNOSIS — O10919 Unspecified pre-existing hypertension complicating pregnancy, unspecified trimester: Secondary | ICD-10-CM | POA: Diagnosis present

## 2022-04-07 DIAGNOSIS — Z0542 Observation and evaluation of newborn for suspected metabolic condition ruled out: Secondary | ICD-10-CM | POA: Diagnosis not present

## 2022-04-07 DIAGNOSIS — O99214 Obesity complicating childbirth: Secondary | ICD-10-CM | POA: Diagnosis present

## 2022-04-07 DIAGNOSIS — O24424 Gestational diabetes mellitus in childbirth, insulin controlled: Secondary | ICD-10-CM | POA: Diagnosis not present

## 2022-04-07 DIAGNOSIS — Z3A37 37 weeks gestation of pregnancy: Secondary | ICD-10-CM

## 2022-04-07 DIAGNOSIS — Z23 Encounter for immunization: Secondary | ICD-10-CM | POA: Diagnosis not present

## 2022-04-07 DIAGNOSIS — O1002 Pre-existing essential hypertension complicating childbirth: Secondary | ICD-10-CM | POA: Diagnosis not present

## 2022-04-07 DIAGNOSIS — O24419 Gestational diabetes mellitus in pregnancy, unspecified control: Secondary | ICD-10-CM | POA: Diagnosis present

## 2022-04-07 DIAGNOSIS — O164 Unspecified maternal hypertension, complicating childbirth: Secondary | ICD-10-CM | POA: Diagnosis not present

## 2022-04-07 DIAGNOSIS — O99824 Streptococcus B carrier state complicating childbirth: Secondary | ICD-10-CM | POA: Diagnosis present

## 2022-04-07 LAB — TYPE AND SCREEN
ABO/RH(D): A POS
Antibody Screen: NEGATIVE

## 2022-04-07 LAB — COMPREHENSIVE METABOLIC PANEL
ALT: 15 U/L (ref 0–44)
AST: 17 U/L (ref 15–41)
Albumin: 2.7 g/dL — ABNORMAL LOW (ref 3.5–5.0)
Alkaline Phosphatase: 132 U/L — ABNORMAL HIGH (ref 38–126)
Anion gap: 10 (ref 5–15)
BUN: <5 mg/dL — ABNORMAL LOW (ref 6–20)
CO2: 21 mmol/L — ABNORMAL LOW (ref 22–32)
Calcium: 8.8 mg/dL — ABNORMAL LOW (ref 8.9–10.3)
Chloride: 103 mmol/L (ref 98–111)
Creatinine, Ser: 0.49 mg/dL (ref 0.44–1.00)
GFR, Estimated: >60 mL/min (ref >60)
Glucose, Bld: 98 mg/dL (ref 70–99)
Potassium: 3.5 mmol/L (ref 3.5–5.1)
Sodium: 134 mmol/L — ABNORMAL LOW (ref 135–145)
Total Bilirubin: 0.1 mg/dL — ABNORMAL LOW (ref 0.3–1.2)
Total Protein: 6.7 g/dL (ref 6.5–8.1)

## 2022-04-07 LAB — PROTEIN / CREATININE RATIO, URINE
Creatinine, Urine: 54.51 mg/dL
Protein Creatinine Ratio: 0.17 mg/mg{Cre} — ABNORMAL HIGH (ref 0.00–0.15)
Total Protein, Urine: 9 mg/dL

## 2022-04-07 LAB — GLUCOSE, CAPILLARY
Glucose-Capillary: 115 mg/dL — ABNORMAL HIGH (ref 70–99)
Glucose-Capillary: 123 mg/dL — ABNORMAL HIGH (ref 70–99)
Glucose-Capillary: 92 mg/dL (ref 70–99)
Glucose-Capillary: 86 mg/dL (ref 70–99)
Glucose-Capillary: 80 mg/dL (ref 70–99)

## 2022-04-07 LAB — RPR: RPR Ser Ql: NONREACTIVE

## 2022-04-07 LAB — CBC
HCT: 36 % (ref 36.0–46.0)
HCT: 35.7 % — ABNORMAL LOW (ref 36.0–46.0)
Hemoglobin: 11.9 g/dL — ABNORMAL LOW (ref 12.0–15.0)
Hemoglobin: 11.9 g/dL — ABNORMAL LOW (ref 12.0–15.0)
MCH: 29.3 pg (ref 26.0–34.0)
MCH: 29.5 pg (ref 26.0–34.0)
MCHC: 33.1 g/dL (ref 30.0–36.0)
MCHC: 33.3 g/dL (ref 30.0–36.0)
MCV: 88.7 fL (ref 80.0–100.0)
MCV: 88.6 fL (ref 80.0–100.0)
Platelets: 368 10*3/uL (ref 150–400)
Platelets: 363 10*3/uL (ref 150–400)
RBC: 4.06 MIL/uL (ref 3.87–5.11)
RBC: 4.03 MIL/uL (ref 3.87–5.11)
RDW: 13.2 % (ref 11.5–15.5)
RDW: 13.3 % (ref 11.5–15.5)
WBC: 12.4 10*3/uL — ABNORMAL HIGH (ref 4.0–10.5)
WBC: 14 10*3/uL — ABNORMAL HIGH (ref 4.0–10.5)
nRBC: 0 % (ref 0.0–0.2)
nRBC: 0 % (ref 0.0–0.2)

## 2022-04-07 MED ORDER — LIDOCAINE HCL (PF) 1 % IJ SOLN
INTRAMUSCULAR | Status: DC | PRN
  Administered 2022-04-07: 3 mL via EPIDURAL
  Administered 2022-04-07: 7 mL via EPIDURAL

## 2022-04-07 MED ORDER — SODIUM CHLORIDE 0.9 % IV SOLN
5.0000 10*6.[IU] | Freq: Once | INTRAVENOUS | Status: AC
  Administered 2022-04-07: 5 10*6.[IU] via INTRAVENOUS
  Filled 2022-04-07: qty 5

## 2022-04-07 MED ORDER — HYDROXYZINE HCL 50 MG PO TABS: 50.0000 mg | ORAL_TABLET | Freq: Four times a day (QID) | ORAL | Status: DC | PRN

## 2022-04-07 MED ORDER — ONDANSETRON HCL 4 MG/2ML IJ SOLN
4.0000 mg | Freq: Four times a day (QID) | INTRAMUSCULAR | Status: DC | PRN
  Administered 2022-04-07: 4 mg via INTRAVENOUS
  Filled 2022-04-07: qty 2

## 2022-04-07 MED ORDER — FENTANYL-BUPIVACAINE-NACL 0.5-0.125-0.9 MG/250ML-% EP SOLN
12.0000 mL/h | EPIDURAL | Status: DC | PRN
  Administered 2022-04-07: 12 mL/h via EPIDURAL
  Filled 2022-04-07: qty 250

## 2022-04-07 MED ORDER — PHENYLEPHRINE 80 MCG/ML (10ML) SYRINGE FOR IV PUSH (FOR BLOOD PRESSURE SUPPORT)
80.0000 ug | PREFILLED_SYRINGE | INTRAVENOUS | Status: DC | PRN
  Filled 2022-04-07: qty 10

## 2022-04-07 MED ORDER — LACTATED RINGERS IV SOLN: 500.0000 mL | INTRAVENOUS | Status: DC | PRN

## 2022-04-07 MED ORDER — ZOLPIDEM TARTRATE 5 MG PO TABS: 5.0000 mg | ORAL_TABLET | Freq: Every evening | ORAL | Status: DC | PRN

## 2022-04-07 MED ORDER — INSULIN ASPART 100 UNIT/ML IJ SOLN
0.0000 [IU] | INTRAMUSCULAR | Status: DC
  Administered 2022-04-08: 1 [IU] via SUBCUTANEOUS

## 2022-04-07 MED ORDER — FLEET ENEMA 7-19 GM/118ML RE ENEM: 1.0000 | ENEMA | Freq: Every day | RECTAL | Status: DC | PRN

## 2022-04-07 MED ORDER — OXYTOCIN-SODIUM CHLORIDE 30-0.9 UT/500ML-% IV SOLN
2.5000 [IU]/h | INTRAVENOUS | Status: DC
  Administered 2022-04-08: 2.5 [IU]/h via INTRAVENOUS

## 2022-04-07 MED ORDER — PHENYLEPHRINE 80 MCG/ML (10ML) SYRINGE FOR IV PUSH (FOR BLOOD PRESSURE SUPPORT): 80.0000 ug | PREFILLED_SYRINGE | INTRAVENOUS | Status: DC | PRN

## 2022-04-07 MED ORDER — DIPHENHYDRAMINE HCL 50 MG/ML IJ SOLN: 12.5000 mg | INTRAMUSCULAR | Status: DC | PRN

## 2022-04-07 MED ORDER — MISOPROSTOL 25 MCG QUARTER TABLET: 25.0000 ug | ORAL_TABLET | ORAL | Status: DC | PRN

## 2022-04-07 MED ORDER — OXYTOCIN BOLUS FROM INFUSION
333.0000 mL | Freq: Once | INTRAVENOUS | Status: AC
  Administered 2022-04-07: 333 mL via INTRAVENOUS

## 2022-04-07 MED ORDER — LACTATED RINGERS IV SOLN
500.0000 mL | Freq: Once | INTRAVENOUS | Status: AC
  Administered 2022-04-07: 500 mL via INTRAVENOUS

## 2022-04-07 MED ORDER — PENICILLIN G POT IN DEXTROSE 60000 UNIT/ML IV SOLN
3.0000 10*6.[IU] | INTRAVENOUS | Status: DC
  Administered 2022-04-07 (×4): 3 10*6.[IU] via INTRAVENOUS
  Filled 2022-04-07 (×4): qty 50

## 2022-04-07 MED ORDER — FENTANYL CITRATE (PF) 100 MCG/2ML IJ SOLN
50.0000 ug | INTRAMUSCULAR | Status: DC | PRN
  Administered 2022-04-07 (×3): 100 ug via INTRAVENOUS
  Filled 2022-04-07 (×3): qty 2

## 2022-04-07 MED ORDER — OXYCODONE-ACETAMINOPHEN 5-325 MG PO TABS: 2.0000 | ORAL_TABLET | ORAL | Status: DC | PRN

## 2022-04-07 MED ORDER — ACETAMINOPHEN 325 MG PO TABS
650.0000 mg | ORAL_TABLET | ORAL | Status: DC | PRN
  Administered 2022-04-07: 650 mg via ORAL
  Filled 2022-04-07: qty 2

## 2022-04-07 MED ORDER — LIDOCAINE HCL (PF) 1 % IJ SOLN: 30.0000 mL | INTRAMUSCULAR | Status: DC | PRN

## 2022-04-07 MED ORDER — EPHEDRINE 5 MG/ML INJ: 10.0000 mg | INTRAVENOUS | Status: DC | PRN

## 2022-04-07 MED ORDER — LACTATED RINGERS IV SOLN: INTRAVENOUS | Status: DC

## 2022-04-07 MED ORDER — SOD CITRATE-CITRIC ACID 500-334 MG/5ML PO SOLN: 30.0000 mL | ORAL | Status: DC | PRN

## 2022-04-07 MED ORDER — OXYTOCIN-SODIUM CHLORIDE 30-0.9 UT/500ML-% IV SOLN
1.0000 m[IU]/min | INTRAVENOUS | Status: DC
  Administered 2022-04-07: 2 m[IU]/min via INTRAVENOUS
  Filled 2022-04-07: qty 500

## 2022-04-07 MED ORDER — OXYCODONE-ACETAMINOPHEN 5-325 MG PO TABS: 1.0000 | ORAL_TABLET | ORAL | Status: DC | PRN

## 2022-04-07 MED ORDER — TERBUTALINE SULFATE 1 MG/ML IJ SOLN: 0.2500 mg | Freq: Once | INTRAMUSCULAR | Status: DC | PRN

## 2022-04-07 NOTE — Progress Notes (Signed)
Labor Progress Note Tina Dixon is a 31 y.o. G1P0 at [redacted]w[redacted]d presented for IOL due to cHTN, FGR, and A2GDM.   S: Doing well. Comfortable with epidural. No concerns.   O:  BP (!) 126/99   Pulse 94   Temp 98.4 F (36.9 C) (Oral)   Resp 16   Ht 5\' 3"  (1.6 m)   Wt 105.4 kg   LMP 07/21/2021   BMI 41.15 kg/m   EFM: Baseline 135 bpm, moderate variability, + accels, no decels  Toco: Every 1-3 minutes   CVE: Dilation: 5 Effacement (%): 50 Station: -3 Presentation: Vertex Exam by:: Dr. Gwenlyn Perking  A&P: 31 y.o. G1P0 [redacted]w[redacted]d   #Labor: Progressing well. Largely unchanged from last check. AROM performed with clear fluid. Mom and baby tolerated this well. IUPC placed. Will assess contraction pattern and adjust Pitocin as needed. Plan to reassess in 3-4 hours after adequate pattern.  #Pain: Epidural  #FWB: Cat 1  #GBS positive; PCN  #cHTN: Normal to mild range BP. Labs stable. No symptoms. Will continue to monitor.   #A2GDM: CBGs remain within normal range. Will continue q4hr glucose checks in latent phase.   Genia Del, MD 6:11 PM

## 2022-04-07 NOTE — H&P (Signed)
OBSTETRIC ADMISSION HISTORY AND PHYSICAL  Tina Dixon is a 31 y.o. female G1P0 with IUP at 94w1dby LMP presenting for IOL for cHTN, FGR (1.6%ile), and A2GDM. She reports +FMs, No LOF, no VB, no blurry vision, headaches or peripheral edema, and RUQ pain.  She plans on breast feeding. She request POPs for birth control. [facility]   Dating: By LMP --->  Estimated Date of Delivery: 04/27/22  Sono:   @[redacted]w[redacted]d ,  normal dopplers, normal anatomy, cephalic presentation, anterior placental lie @ 336w5d1844g, 1.6% EFW   Prenatal History/Complications:  Severe FGR cHTN A2GDM on Metformin Depression on Zoloft   Past Medical History: Past Medical History:  Diagnosis Date   Anxiety    Depression    Gestational diabetes    Gestational diabetes mellitus, antepartum 02/08/2022    Past Surgical History: Past Surgical History:  Procedure Laterality Date   WISDOM TOOTH EXTRACTION      Obstetrical History: OB History     Gravida  1   Para      Term      Preterm      AB      Living         SAB      IAB      Ectopic      Multiple      Live Births              Social History Social History   Socioeconomic History   Marital status: Married    Spouse name: BrGaspar Bidding Number of children: Not on file   Years of education: Not on file   Highest education level: Not on file  Occupational History   Occupation: Hair Stylist  Tobacco Use   Smoking status: Never   Smokeless tobacco: Never  Vaping Use   Vaping Use: Never used  Substance and Sexual Activity   Alcohol use: No   Drug use: No   Sexual activity: Yes    Birth control/protection: None  Other Topics Concern   Not on file  Social History Narrative   ** Merged History Encounter **       Social Determinants of Health   Financial Resource Strain: Not on file  Food Insecurity: Not on file  Transportation Needs: Not on file  Physical Activity: Not on file  Stress: Not on  file  Social Connections: Not on file    Family History: Family History  Problem Relation Age of Onset   Hypertension Mother    Cancer Father        pancreatic   Pancreatitis Father    Diabetes Father    Hypertension Maternal Grandfather    Diabetes Maternal Grandmother    Hypertension Maternal Grandmother    Heart disease Paternal Grandmother     Allergies: No Known Allergies  Medications Prior to Admission  Medication Sig Dispense Refill Last Dose   Accu-Chek Softclix Lancets lancets 1 each by Other route 4 (four) times daily. 100 each 12 04/06/2022   aspirin EC 81 MG tablet Take 1 tablet (81 mg total) by mouth daily. Take after 12 weeks for prevention of preeclampsia later in pregnancy 300 tablet 2 04/06/2022   Blood Glucose Monitoring Suppl (ACCU-CHEK GUIDE) w/Device KIT 1 Device by Does not apply route 4 (four) times daily. 1 kit 0 04/06/2022   glucose blood (ACCU-CHEK GUIDE) test strip Use to check blood sugars four times a day was instructed 100 each 12  04/06/2022   metFORMIN (GLUCOPHAGE) 1000 MG tablet Take 1 tablet (1,000 mg total) by mouth at bedtime. 30 tablet 3 Past Week   Prenatal MV & Min w/FA-DHA (PRENATAL GUMMIES PO) Take 2 tablets by mouth daily.   04/06/2022   sertraline (ZOLOFT) 100 MG tablet Take 1 tablet (100 mg total) by mouth daily. 30 tablet 3 04/06/2022   terconazole (TERAZOL 3) 0.8 % vaginal cream Place 1 applicator vaginally at bedtime. Apply nightly for three nights. 20 g 0 Past Week   ondansetron (ZOFRAN-ODT) 4 MG disintegrating tablet Take 1 tablet (4 mg total) by mouth every 8 (eight) hours as needed for nausea or vomiting. 30 tablet 1 More than a month     Review of Systems   All systems reviewed and negative except as stated in HPI  Blood pressure 120/64, pulse 86, temperature 98.1 F (36.7 C), temperature source Oral, resp. rate 16, height 5' 3"  (1.6 m), weight 105.4 kg, last menstrual period 07/21/2021. General appearance: alert Lungs: clear to  auscultation bilaterally Heart: regular rate and rhythm Abdomen: soft, non-tender; bowel sounds normal Extremities: Homans sign is negative, no sign of DVT Presentation: cephalic by BSUS Fetal monitoring Baseline: 140-150 bpm, Variability: Good {> 6 bpm), Accelerations: Reactive, and Decelerations: Absent Uterine activity irregular Dilation: 1.5 Effacement (%): 50 Station: -3 Exam by:: Darlin Priestly, MD   Prenatal labs: ABO, Rh: --/--/A POS (05/26 0020) Antibody: NEG (05/26 0020) Rubella: 1.08 (12/07 1130) RPR: Non Reactive (03/28 0853)  HBsAg: Negative (12/07 1130)  HIV: Non Reactive (03/28 0853)  GBS: Positive/-- (05/09 1106)  2 hr Glucola abnormal Genetic screening AFP normal, LR female. Anatomy US normal (expect of FGR)  Prenatal Transfer Tool  Maternal Diabetes: Yes:  Diabetes Type:  Insulin/Medication controlled Genetic Screening: Normal Maternal Ultrasounds/Referrals: IUGR Fetal Ultrasounds or other Referrals:  None Maternal Substance Abuse:  No Significant Maternal Medications:  Meds include: Zoloft Significant Maternal Lab Results: Group B Strep positive  Results for orders placed or performed during the hospital encounter of 04/07/22 (from the past 24 hour(s))  CBC   Collection Time: 04/07/22 12:20 AM  Result Value Ref Range   WBC 12.4 (H) 4.0 - 10.5 K/uL   RBC 4.06 3.87 - 5.11 MIL/uL   Hemoglobin 11.9 (L) 12.0 - 15.0 g/dL   HCT 36.0 36.0 - 46.0 %   MCV 88.7 80.0 - 100.0 fL   MCH 29.3 26.0 - 34.0 pg   MCHC 33.1 30.0 - 36.0 g/dL   RDW 13.2 11.5 - 15.5 %   Platelets 368 150 - 400 K/uL   nRBC 0.0 0.0 - 0.2 %  Comprehensive metabolic panel   Collection Time: 04/07/22 12:20 AM  Result Value Ref Range   Sodium 134 (L) 135 - 145 mmol/L   Potassium 3.5 3.5 - 5.1 mmol/L   Chloride 103 98 - 111 mmol/L   CO2 21 (L) 22 - 32 mmol/L   Glucose, Bld 98 70 - 99 mg/dL   BUN <5 (L) 6 - 20 mg/dL   Creatinine, Ser 0.49 0.44 - 1.00 mg/dL   Calcium 8.8 (L) 8.9 - 10.3 mg/dL    Total Protein 6.7 6.5 - 8.1 g/dL   Albumin 2.7 (L) 3.5 - 5.0 g/dL   AST 17 15 - 41 U/L   ALT 15 0 - 44 U/L   Alkaline Phosphatase 132 (H) 38 - 126 U/L   Total Bilirubin 0.1 (L) 0.3 - 1.2 mg/dL   GFR, Estimated >60 >60 mL/min   Anion gap  10 5 - 15  Type and screen   Collection Time: 04/07/22 12:20 AM  Result Value Ref Range   ABO/RH(D) A POS    Antibody Screen NEG    Sample Expiration      04/10/2022,2359 Performed at [facility], [place] [address], [place], [place] [address]   Glucose, capillary   Collection Time: 04/07/22  1:53 AM  Result Value Ref Range   Glucose-Capillary 115 (H) 70 - 99 mg/dL    Patient Active Problem List   Diagnosis Date Noted   Group B Streptococcus carrier, +RV culture, currently pregnant 03/23/2022   Severe IUGR (intrauterine growth restriction) affecting care of mother, third trimester 03/07/2022   Gestational diabetes mellitus, antepartum 02/08/2022   Maternal morbid obesity, antepartum ([place]) 10/19/2021   Depression affecting pregnancy 10/19/2021   Chronic hypertension during pregnancy 10/19/2021   Supervision of high-risk pregnancy 09/26/2021    Assessment/Plan:  Mable Dara is a 31 y.o. G1P0 at 11w1dhere for IOL for cHTN, A2GDM, severe FGR (1.6%ile)  #Labor: Presents for IOL, Cervix 2 cm. FB placed. Given severe FGR will hold off on cytotec at this time.   If needing to add something on to bring on contractions will try pitocin. If fetus tolerates of contractions with pitocin can consider switching to cytotec.  #Pain: IV pain meds and plans epidural #FWB:  Cat I  #ID:  GBS pos>PCN #MOF: breast #MOC: POPs #Circ:  Female   #cHTN Initial BP 129/83. No symptoms.  Admission preE labs sent.  #A2GDM On Metformin. Last dose . Initial BG here 90s-110s - q4hr checks and switch to q2hr once more active labor. Can add on SSI if warranted  #Severe FGR Normal dopplers and BPP on 5/23.   ARenard Matter MD, MPH OB Fellow, Faculty  Practice

## 2022-04-07 NOTE — Progress Notes (Signed)
Labor Progress Note Tina Dixon is a 31 y.o. G1P0 at [redacted]w[redacted]d presented for IOL for severe FGR cHTN and A2GDM  S:  Doing well. Feeling comfortable with epidural  O:  BP (!) 107/51   Pulse 82   Temp 98.4 F (36.9 C) (Oral)   Resp 14   Ht 5\' 3"  (1.6 m)   Wt 105.4 kg   LMP 07/21/2021   BMI 41.15 kg/m  EFM: 150/moderate variability/accels present/no decels  CVE: Dilation: 5 Effacement (%): 50 Station: -3 Presentation: Vertex Exam by:: Dr. [email or web address]   A&P: 31 y.o. G1P0 at [redacted]w[redacted]d presented for IOL for severe FGR cHTN and A2GDM #Labor: Patient on pitocin and now s/p AROM at ~1800. MVUs adequate. Continue pitocin at current dose. Will plan to check at 10PM to assess progress and positioning as at times contraction pattern dysfunctional. #Pain: epidural #FWB: Cat I  #GBS positive on PCN   [redacted]w[redacted]d, MD, MPH OB Fellow, Faculty Practice

## 2022-04-07 NOTE — Progress Notes (Signed)
Labor Progress Note Tina Dixon is a 31 y.o. G1P0 at [redacted]w[redacted]d who presented for IOL due to cHTN, FGR (1.6%), and A2GDM.   S: Doing well. Feeling some contractions, rates them a 4-5 out of 10 in severity. No other concerns at this time.   O:  BP 115/68   Pulse (!) 102   Temp 98.3 F (36.8 C) (Oral)   Resp 15   Ht 5\' 3"  (1.6 m)   Wt 105.4 kg   LMP 07/21/2021   BMI 41.15 kg/m   EFM: Baseline 135 bpm, moderate variability, + accels, no decels  Toco: Every 2-3 minutes   CVE: Dilation: 1.5 Effacement (%): 50 Station: -3 Exam by:: [email or web address], MD  A&P: 31 y.o. G1P0 [redacted]w[redacted]d   #Labor: Progressing well. Foley balloon now out. Will start Pitocin 2x2 and reassess in 4 hours. Plan for AROM on next exam as appropriate.  #Pain: PRN #FWB: Cat 1  #GBS positive; PCN  #A2GDM: CBGs remain within normal range. Will continue q4hr glucose checks in latent phase.   #cHTN: Normal BP since admission. No symptoms. Will continue to monitor.   [redacted]w[redacted]d, MD 9:46 AM

## 2022-04-07 NOTE — Progress Notes (Signed)
Patient called out feeling a lot of pressure  Cervix checked, 10 cm and 0 to +1 station.  Will get delivery table ready and then plan to do some practice pushes as patient feeling pressure that's constant and suspect small baby. If no head descent will plan to labor down.  Renard Matter, MD, MPH OB Fellow, Faculty Practice

## 2022-04-07 NOTE — Progress Notes (Signed)
Spoke with RN on phone regarding patient.  Tugged on FB and out now. Discussed that day team to come check patient after sign out and assess for next steps.  Patient's BG also reviewed. BG in 110s-120s. SSI ordered.   Warner Mccreedy, MD, MPH OB Fellow, Faculty Practice

## 2022-04-07 NOTE — Anesthesia Preprocedure Evaluation (Signed)
Anesthesia Evaluation  Patient identified by MRN, date of birth, ID band Patient awake    Reviewed: Allergy & Precautions, H&P , NPO status , Patient's Chart, lab work & pertinent test results  History of Anesthesia Complications Negative for: history of anesthetic complications  Airway Mallampati: II  TM Distance: >3 FB     Dental   Pulmonary neg pulmonary ROS,    Pulmonary exam normal        Cardiovascular hypertension,  Rhythm:regular Rate:Normal     Neuro/Psych negative neurological ROS  negative psych ROS   GI/Hepatic negative GI ROS, Neg liver ROS,   Endo/Other  diabetes, GestationalMorbid obesity  Renal/GU      Musculoskeletal   Abdominal   Peds  Hematology negative hematology ROS (+)   Anesthesia Other Findings   Reproductive/Obstetrics (+) Pregnancy                            Anesthesia Physical Anesthesia Plan  ASA: 3  Anesthesia Plan: Epidural   Post-op Pain Management:    Induction:   PONV Risk Score and Plan:   Airway Management Planned:   Additional Equipment:   Intra-op Plan:   Post-operative Plan:   Informed Consent: I have reviewed the patients History and Physical, chart, labs and discussed the procedure including the risks, benefits and alternatives for the proposed anesthesia with the patient or authorized representative who has indicated his/her understanding and acceptance.       Plan Discussed with:   Anesthesia Plan Comments:         Anesthesia Quick Evaluation

## 2022-04-07 NOTE — Anesthesia Procedure Notes (Signed)
Epidural Patient location during procedure: OB Start time: 04/07/2022 2:23 PM End time: 04/07/2022 2:32 PM  Staffing Anesthesiologist: Lucretia Kern, MD Performed: anesthesiologist   Preanesthetic Checklist Completed: patient identified, IV checked, risks and benefits discussed, monitors and equipment checked, pre-op evaluation and timeout performed  Epidural Patient position: sitting Prep: DuraPrep Patient monitoring: heart rate, continuous pulse ox and blood pressure Approach: midline Location: L3-L4 Injection technique: LOR air  Needle:  Needle type: Tuohy  Needle gauge: 17 G Needle length: 9 cm Needle insertion depth: 7 cm Catheter type: closed end flexible Catheter size: 19 Gauge Catheter at skin depth: 12 cm Test dose: negative  Assessment Events: blood not aspirated, injection not painful, no injection resistance, no paresthesia and negative IV test  Additional Notes Reason for block:procedure for pain

## 2022-04-08 ENCOUNTER — Encounter (HOSPITAL_COMMUNITY): Payer: Self-pay | Admitting: Obstetrics & Gynecology

## 2022-04-08 LAB — GLUCOSE, CAPILLARY: Glucose-Capillary: 107 mg/dL — ABNORMAL HIGH (ref 70–99)

## 2022-04-08 MED ORDER — IBUPROFEN 600 MG PO TABS
600.0000 mg | ORAL_TABLET | Freq: Four times a day (QID) | ORAL | Status: DC
  Administered 2022-04-08 – 2022-04-09 (×6): 600 mg via ORAL
  Filled 2022-04-08 (×6): qty 1

## 2022-04-08 MED ORDER — MEASLES, MUMPS & RUBELLA VAC IJ SOLR: 0.5000 mL | Freq: Once | INTRAMUSCULAR | Status: DC

## 2022-04-08 MED ORDER — MEDROXYPROGESTERONE ACETATE 150 MG/ML IM SUSP: 150.0000 mg | INTRAMUSCULAR | Status: DC | PRN

## 2022-04-08 MED ORDER — ACETAMINOPHEN 325 MG PO TABS: 650.0000 mg | ORAL_TABLET | ORAL | Status: DC | PRN

## 2022-04-08 MED ORDER — TETANUS-DIPHTH-ACELL PERTUSSIS 5-2.5-18.5 LF-MCG/0.5 IM SUSY: 0.5000 mL | PREFILLED_SYRINGE | Freq: Once | INTRAMUSCULAR | Status: DC

## 2022-04-08 MED ORDER — PRENATAL MULTIVITAMIN CH
1.0000 | ORAL_TABLET | Freq: Every day | ORAL | Status: DC
  Administered 2022-04-08 – 2022-04-09 (×2): 1 via ORAL
  Filled 2022-04-08 (×2): qty 1

## 2022-04-08 MED ORDER — ONDANSETRON HCL 4 MG PO TABS: 4.0000 mg | ORAL_TABLET | ORAL | Status: DC | PRN

## 2022-04-08 MED ORDER — ONDANSETRON HCL 4 MG/2ML IJ SOLN: 4.0000 mg | INTRAMUSCULAR | Status: DC | PRN

## 2022-04-08 MED ORDER — BENZOCAINE-MENTHOL 20-0.5 % EX AERO
1.0000 "application " | INHALATION_SPRAY | CUTANEOUS | Status: DC | PRN
  Administered 2022-04-08: 1 via TOPICAL
  Filled 2022-04-08: qty 56

## 2022-04-08 MED ORDER — DIBUCAINE (PERIANAL) 1 % EX OINT: 1.0000 "application " | TOPICAL_OINTMENT | CUTANEOUS | Status: DC | PRN

## 2022-04-08 MED ORDER — SERTRALINE HCL 100 MG PO TABS
100.0000 mg | ORAL_TABLET | Freq: Every day | ORAL | Status: DC
Start: 2022-04-08 — End: 2022-04-09
  Administered 2022-04-08 – 2022-04-09 (×2): 100 mg via ORAL
  Filled 2022-04-08 (×2): qty 1

## 2022-04-08 MED ORDER — COCONUT OIL OIL
1.0000 "application " | TOPICAL_OIL | Status: DC | PRN
  Administered 2022-04-08: 1 via TOPICAL

## 2022-04-08 MED ORDER — SENNOSIDES-DOCUSATE SODIUM 8.6-50 MG PO TABS
2.0000 | ORAL_TABLET | Freq: Every day | ORAL | Status: DC
  Administered 2022-04-09: 2 via ORAL
  Filled 2022-04-08: qty 2

## 2022-04-08 MED ORDER — SIMETHICONE 80 MG PO CHEW: 80.0000 mg | CHEWABLE_TABLET | ORAL | Status: DC | PRN

## 2022-04-08 MED ORDER — WITCH HAZEL-GLYCERIN EX PADS: 1.0000 "application " | MEDICATED_PAD | CUTANEOUS | Status: DC | PRN

## 2022-04-08 MED ORDER — DIPHENHYDRAMINE HCL 25 MG PO CAPS: 25.0000 mg | ORAL_CAPSULE | Freq: Four times a day (QID) | ORAL | Status: DC | PRN

## 2022-04-08 MED ORDER — FUROSEMIDE 20 MG PO TABS
20.0000 mg | ORAL_TABLET | Freq: Every day | ORAL | Status: DC
  Administered 2022-04-08 – 2022-04-09 (×2): 20 mg via ORAL
  Filled 2022-04-08 (×2): qty 1

## 2022-04-08 NOTE — Lactation Note (Signed)
This note was copied from a baby's chart. Lactation Consultation Note  Patient Name: Tina Dixon WUJWJ'[id number] Date: 04/08/2022 Reason for consult: Follow-up assessment;Difficult latch;1st time breastfeeding;Early term 37-38.6wks (IURG, -2% weight loss, less than 6 lbs) Age:30 hours P1, ETI female infant. Per mom, she does have DEBP at home. Mom's current feeding choice is: " pumping only " and formula feeding infant.  LC reviewed how to use DEBP, mom pumped 5 mls of colostrum that she will offer to infant at the next feeding and then supplement infant with formula.  Per dad, infant took 10 mls of formula 22 kcal Similac Neosure at 1430 pm, infant is using White Nfant bottle nipple. Mom understands EBM is good at room temperature for 4 hours whereas formula is one hour. Mom will continue to pump every 3 hours for 15 minutes on initial setting and give infant back any EBM first and then supplement infant with formula. Mom will continue to feeding infant by cues, 8 or more times within 24 hours. Family offering total volume day 1 ( 10-15 mls per feeding). Mom knows to call RN/LC if she has BF questions or concerns.  Maternal Data    Feeding Mother's Current Feeding Choice: Breast Milk and Formula Nipple Type: Nfant Standard Flow (white)  LATCH Score                    Lactation Tools Discussed/Used Tools: Pump;Flanges Flange Size: 21 Breast pump type: Double-Electric Breast Pump Pump Education: Setup, frequency, and cleaning;Milk Storage Reason for Pumping: Infant not latching well at breast, see previous documentation of mom's nipple type, IUGR, less than 6 lbs at birth. Pumping frequency: Mom will continue to pump every 3 hours for 15 minutes on inital setting. Pumped volume: 5 mL  Interventions Interventions: Education;Pace feeding;DEBP  Discharge Pump: Personal (Per mom, she has DEBP at home ,she can't remeber the brand name.)  Consult Status Consult Status:  Follow-up Date: 04/09/22 Follow-up type: In-patient    Danelle Earthly 04/08/2022, 5:31 PM

## 2022-04-08 NOTE — Anesthesia Postprocedure Evaluation (Signed)
Anesthesia Post Note  Patient: Tina Dixon  Procedure(s) Performed: AN AD HOC LABOR EPIDURAL     Patient location during evaluation: Mother Baby Anesthesia Type: Epidural Level of consciousness: awake and alert Pain management: pain level controlled Vital Signs Assessment: post-procedure vital signs reviewed and stable Respiratory status: spontaneous breathing, nonlabored ventilation and respiratory function stable Cardiovascular status: stable Postop Assessment: no headache, no backache and epidural receding Anesthetic complications: no   No notable events documented.  Last Vitals:  Vitals:   04/08/22 0742 04/08/22 1030  BP: 116/73 (!) 116/58  Pulse: 80 77  Resp: 18 16  Temp: 37.1 C 36.8 C  SpO2: 97% 97%    Last Pain:  Vitals:   04/08/22 1030  TempSrc: Oral  PainSc: 0-No pain   Pain Goal:                   [name]

## 2022-04-08 NOTE — Progress Notes (Signed)
POSTPARTUM PROGRESS NOTE  Subjective: Tina Dixon is a 31 y.o. G1P1001 PPD#1 s/p SVD at [redacted]w[redacted]d.  She reports she doing well. No acute events overnight. She denies any problems with ambulating, voiding or po intake. Denies nausea or vomiting. She has  passed flatus. Pain is well controlled.  Lochia is scant.  Objective: Blood pressure 116/69, pulse 92, temperature 98.5 F (36.9 C), temperature source Oral, resp. rate 18, height 5\' 3"  (1.6 m), weight 105.4 kg, last menstrual period 07/21/2021, SpO2 100 %, unknown if currently breastfeeding.  Physical Exam:  General: alert, cooperative and no distress Chest: no respiratory distress Abdomen: soft, non-tender  Uterine Fundus: firm, appropriately tender Extremities: No calf swelling or tenderness  no edema  Recent Labs    04/07/22 0020 04/07/22 0827  HGB 11.9* 11.9*  HCT 36.0 35.7*    Assessment/Plan: Tina Dixon is a 31 y.o. G1P1001 PPD#1 s/p SVD at [redacted]w[redacted]d.  Routine Postpartum Care: Doing well, pain well-controlled.  -- Continue routine care, lactation support  -- Contraception: POPs -- Feeding: breast  #cHTN Not on meds in pregnancy. Plan to start 5 day course of Lasix. Will monitor BP today and can start Procardia if meets criteria  #A2GDM Late night delivery last night. Plan to check FBG on 5/28 AM  Dispo: Plan for discharge PPD#2 given late evening delivery.  Renard Matter, MD, MPH OB Fellow, [facility] for [facility]

## 2022-04-08 NOTE — Lactation Note (Signed)
This note was copied from a baby's chart. Lactation Consultation Note  Patient Name: Tina Dixon [id number] Date: 04/08/2022 Reason for consult: L&D Initial assessment Age:30 hours P1, ETI female infant less than 6 lbs. Per mom, she leaked colostrum in her pregnancy. LC ask mom do breast stimulation prior to latching infant at the breast to help evert nipple shaft out more prior to latching infant at the breast.  Mom latched infant on her right breast using the football hold position, infant was on and off breast for 12 minutes. Mom will continue to breastfeed infant according to hunger cues, on demand, skin to skin. Mom already has abrasion on her right nipple, LC mention if she feels pinching  to break latch and re-latch infant, make sure infant is suckling on mom's areola and not the side of the nipple. Afterwards mom was doing skin to skin with infant. Mom could benefit from having hand pump with 21 breast flange, to pre-pump breast prior to latching infant at the breast. See mom's nipple type below at [facility] assessment.  LC congratulated parents on the birth of their daughter. Maternal Data Does the patient have breastfeeding experience prior to this delivery?: No  Feeding Mother's Current Feeding Choice: Breast Milk  LATCH Score Latch: Repeated attempts needed to sustain latch, nipple held in mouth throughout feeding, stimulation needed to elicit sucking reflex.  Audible Swallowing: A few with stimulation  Type of Nipple: Flat  Comfort (Breast/Nipple): Filling, red/small blisters or bruises, mild/mod discomfort  Hold (Positioning): Assistance needed to correctly position infant at breast and maintain latch.  LATCH Score: 5   Lactation Tools Discussed/Used    Interventions Interventions: Assisted with latch;Skin to skin;Breast compression;Adjust position;Support pillows;Position options;Education  Discharge    Consult Status Consult Status: Follow-up from  L&D    Danelle Earthly 04/08/2022, 1:12 AM

## 2022-04-08 NOTE — Progress Notes (Signed)
MOB was referred for history of depression/anxiety. * Referral screened out by Clinical Social Worker because none of the following criteria appear to apply: ~ History of anxiety/depression during this pregnancy, or of post-partum depression following prior delivery. ~ Diagnosis of anxiety and/or depression within last 3 years OR * MOB's symptoms currently being treated with medication and/or therapy. MOB has an active Rx for Zoloft.  Per MOB's last OB visit MOB is doing well with medication.   Please contact the Clinical Social Worker if needs arise, by [facility] request, or if MOB scores greater than 9/yes to question 10 on Edinburgh Postpartum Depression Screen.   Laurey Arrow, MSW, LCSW Clinical Social Work [phone]

## 2022-04-08 NOTE — Discharge Summary (Signed)
Postpartum Discharge Summary  Date of Service updated***     Patient Name: Tina Dixon DOB: 1991-04-20 MRN: [id number]  Date of admission: 04/07/2022 Delivery date:04/07/2022  Delivering provider: Renard Matter  Date of discharge: 04/08/2022  Admitting diagnosis: IUGR (intrauterine growth restriction) affecting care of mother, third trimester, not applicable or unspecified fetus [O36.5930] Intrauterine pregnancy: [redacted]w[redacted]d    Secondary diagnosis:  Principal Problem:   Severe IUGR (intrauterine growth restriction) affecting care of mother, third trimester Active Problems:   Supervision of high-risk pregnancy   Chronic hypertension during pregnancy   Gestational diabetes mellitus, antepartum   Group B Streptococcus carrier, +RV culture, currently pregnant   Vaginal delivery  Additional problems: ***None    Discharge diagnosis: Term Pregnancy Delivered, CHTN, and GDM A2                                              Post partum procedures:*** Augmentation: AROM, Pitocin, and IP Foley Complications: None  Hospital course: Induction of Labor With Vaginal Delivery   31y.o. yo G1P0 at 322w2das admitted to the hospital 04/07/2022 for induction of labor.  Indication for induction:  severe FGR also with cHTN (no meds) and  .  Patient had an uncomplicated labor course as follows: Membrane Rupture Time/Date: 6:00 PM ,04/07/2022   Delivery Method:Vaginal, Spontaneous  Episiotomy: None  Lacerations:  2nd degree  Details of delivery can be found in separate delivery note.  Patient had a routine postpartum course. Patient is discharged home 04/08/22.  Newborn Data: Birth date:04/07/2022  Birth time:11:36 PM  Gender:Female  Living status:Living  Apgars:8 ,9  Weight:   Magnesium Sulfate received: No BMZ received: No Rhophylac:N/A MMR:N/A T-DaP:Given prenatally Flu: Yes Transfusion:{Transfusion received:30440034}  Physical exam  Vitals:   04/07/22 2302 04/08/22 0001 04/08/22 0015  04/08/22 0030  BP: 125/82 (!) 114/57 (!) 116/54 116/82  Pulse: 86 (!) 103 87 82  Resp: 18  16 16   Temp:      TempSrc:      Weight:      Height:       General: {Exam; general:21111117} Lochia: {Desc; appropriate/inappropriate:30686::"appropriate"} Uterine Fundus: {Desc; firm/soft:30687} Incision: {Exam; incision:21111123} DVT Evaluation: {Exam; dvt:2111122} Labs: Lab Results  Component Value Date   WBC 14.0 (H) 04/07/2022   HGB 11.9 (L) 04/07/2022   HCT 35.7 (L) 04/07/2022   MCV 88.6 04/07/2022   PLT 363 04/07/2022      Latest Ref Rng & Units 04/07/2022   12:20 AM  CMP  Glucose 70 - 99 mg/dL 98    BUN 6 - 20 mg/dL <5    Creatinine 0.44 - 1.00 mg/dL 0.49    Sodium 135 - 145 mmol/L 134    Potassium 3.5 - 5.1 mmol/L 3.5    Chloride 98 - 111 mmol/L 103    CO2 22 - 32 mmol/L 21    Calcium 8.9 - 10.3 mg/dL 8.8    Total Protein 6.5 - 8.1 g/dL 6.7    Total Bilirubin 0.3 - 1.2 mg/dL 0.1    Alkaline Phos 38 - 126 U/L 132    AST 15 - 41 U/L 17    ALT 0 - 44 U/L 15     Edinburgh Score:     View : No data to display.           After visit meds:  Allergies  as of 04/08/2022   No Known Allergies   Med Rec must be completed prior to using this [facility]***        Discharge home in stable condition Infant Feeding: {Baby feeding:23562} Infant Disposition:{CHL IP OB HOME WITH YOFVWA:67737} Discharge instruction: per After Visit Summary and Postpartum booklet. Activity: Advance as tolerated. Pelvic rest for 6 weeks.  Diet: {OB VGKK:15947076} Future Appointments:No future appointments. Follow up Visit: Message sent to [facility] by Dr. Cy Blamer on 5/27  Please schedule this patient for a In person postpartum visit in 4 weeks with the following provider: Any provider. Additional Postpartum F/U:2 hour GTT and BP check 1 week  High risk pregnancy complicated by: GDM, HTN, and FGR (severe) Delivery mode:  Vaginal, Spontaneous  Anticipated Birth Control:  POPs   04/08/2022 Renard Matter, MD

## 2022-04-08 NOTE — Lactation Note (Signed)
This note was copied from a baby's chart. Lactation Consultation Note  Patient Name: Tina Dixon [id number] Date: 04/08/2022 Reason for consult: Initial assessment;Primapara;1st time breastfeeding;Infant < 6lbs;Early term 37-38.6wks Age:30 hours   P1 mother whose infant is now 31 hours old.  This is an early term baby at 37+1 weeks weighing < 6 lbs.  Mother's current feeding preference is breast.  Baby beginning to awaken; offered to assist with latching and mother agreeable.  Taught hand expression; no drops noted at this time.  Suggested mother pre-pump with manual pump to help evert nipples.  Attempted to latch, however, "[place]" was not interested.  Encouraged lots of STS, breast massage and hand expression.  Discussed finger feeding/spoon feeding colostrum.  Offered to set up the DEBP for stimulation.  Provided the #21 flanges and showed mother how to lubricate with coconut oil prior to pumping.  She had a large reddened bruise to her right areola from a poor latch.  Mother will feed at least every three hours due to gestational age and weight and sooner if "[place] Ar" shows cues.  Suggested she call for latch assistance as needed.  Discussed possibly beginning supplementation if baby does not latch and no colostrum drops expressed.  Mother verbalized understanding.  Father present and asleep on the couch.   Maternal Data Has patient been taught Hand Expression?: Yes Does the patient have breastfeeding experience prior to this delivery?: No  Feeding Mother's Current Feeding Choice: Breast Milk  LATCH Score Latch: Too sleepy or reluctant, no latch achieved, no sucking elicited.  Audible Swallowing: None  Type of Nipple: Flat  Comfort (Breast/Nipple): Soft / non-tender  Hold (Positioning): Assistance needed to correctly position infant at breast and maintain latch.  LATCH Score: 4   Lactation Tools Discussed/Used    Interventions Interventions: Assisted with latch;Breast  feeding basics reviewed;Skin to skin;Breast massage;Hand express;Coconut oil;Pre-pump if needed;Position options;Support pillows;Adjust position;DEBP;Education;LC Services brochure  Discharge Pump: Personal (Mother unsure of brand)  Consult Status Consult Status: Follow-up Date: 04/09/22 Follow-up type: In-patient    Colletta Spillers R Garren Greenman 04/08/2022, 4:36 AM

## 2022-04-09 LAB — GLUCOSE, CAPILLARY: Glucose-Capillary: 84 mg/dL (ref 70–99)

## 2022-04-09 MED ORDER — IBUPROFEN 600 MG PO TABS: 600.0000 mg | ORAL_TABLET | Freq: Four times a day (QID) | ORAL | 0 refills | Status: DC

## 2022-04-09 MED ORDER — FUROSEMIDE 20 MG PO TABS: 20.0000 mg | ORAL_TABLET | Freq: Every day | ORAL | 0 refills | Status: DC

## 2022-04-10 ENCOUNTER — Ambulatory Visit: Payer: Self-pay

## 2022-04-10 NOTE — Lactation Note (Signed)
This note was copied from a baby's chart. Lactation Consultation Note  Patient Name: Tina Dixon [id number] Date: 04/10/2022   Age:30 hours  Juda visit was attempted, but parents were cuddling in bed.    Matthias Hughs [facility] 04/10/2022, 1:10 PM

## 2022-04-10 NOTE — Lactation Note (Signed)
This note was copied from a baby's chart. Lactation Consultation Note  Patient Name: Tina Dixon EHUDJ'S Date: 04/10/2022 Reason for consult: Follow-up assessment;Primapara;1st time breastfeeding;Infant < 6lbs;Exclusive pumping and bottle feeding Age:30 hours   P1 mother whose infant is now 37 hours old.  This is an early term infant at 37+1 weeks weighing < 6 lbs.  Mother's current feeding preference is to pump and bottle feed.  She is currently supplementing with Similac 22 calorie formula until her milk comes to volume.  Mother reported no concerns with formula feeding.  Baby "Colin Mulders" has been consuming between 15-30 mls, primarily 15 mls every 3-4 hours.  Suggested mother increase volumes to 20-30 mls every three hours and to not let her go longer than 3 hours between feedings.  Also, if "Colin Mulders" continues to consistently consume 15 mls I suggested possibly getting her to feed 15 mls every two hours to increase total volume.  Mother verbalized understanding.    Mother is obtaining approximately 5 mls/pumping session.  Reminded her to pump every thee hours to help increase her milk supply.  She is feeding her expressed milk prior to giving formula supplementation.  Praised mother for her efforts.  Father at bedside.     Maternal Data Has patient been taught Hand Expression?: Yes Does the patient have breastfeeding experience prior to this delivery?: No  Feeding Mother's Current Feeding Choice: Breast Milk and Formula Nipple Type: Slow - flow  LATCH Score                    Lactation Tools Discussed/Used Tools: Pump Flange Size: 21 Breast pump type: Double-Electric Breast Pump;Manual Pump Education: Setup, frequency, and cleaning (No review needed) Reason for Pumping: Mother's desire to pump and bottle feed only Pumping frequency: Every three hours  Interventions Interventions: Education  Discharge Pump: Personal (Mother unsure of brand)  Consult  Status Consult Status: Follow-up Date: 04/11/22 Follow-up type: In-patient    [name] 04/10/2022, 4:55 PM

## 2022-04-10 NOTE — Lactation Note (Signed)
This note was copied from a baby's chart. Lactation Consultation Note  Patient Name: Girl Dyanna Seiter YTKPT'W Date: 04/10/2022   Age:30 hours Mom's feeding choice is "pumping" only and formula feeding infant.  Per mom, she feels her feeding plan is going well, she is using the DEBP every 3 hours for 15 minutes, she is expressing 5 - 8 mls that she is offering to infant first before formula.  Infant is consuming 35 mls of formula per feeding on Day 3 of life, she is pacing feeding infant, feeding infant by cues, 8 to 12+ times within 24 hours. Mom knows she can call [facility] services if she has any BF questions or concerns.  Maternal Data    Feeding    LATCH Score                    Lactation Tools Discussed/Used    Interventions    Discharge    Consult Status      Danelle Earthly 04/10/2022, 1:01 AM

## 2022-04-11 ENCOUNTER — Ambulatory Visit: Payer: Self-pay

## 2022-04-11 ENCOUNTER — Encounter: Payer: BC Managed Care – PPO | Admitting: Obstetrics and Gynecology

## 2022-04-11 LAB — SURGICAL PATHOLOGY

## 2022-04-11 NOTE — Lactation Note (Addendum)
This note was copied from a baby's chart. Lactation Consultation Note  Patient Name: Tina Dixon Date: 04/11/2022 Reason for consult: Follow-up assessment;Exclusive pumping and bottle feeding;1st time breastfeeding;Early term 37-38.6wks;Infant < 6lbs;Maternal endocrine disorder GDM (Metformin) Age:30 days  LC in to visit with P1 Mom of ET infant on day of discharge.  Baby is at  6.8% weight loss, an increase of 15 gms from yesterday.   Baby has been consistently more volume, 40 ml at last feeding.  Baby appears jaundiced, but bilirubin levels are below phototherapy levels.  Mom is not interested in latching baby to the breast.  Mom has been pumping, last time she expressed 10 ml.  Talked about importance of consistent pumping.  Mom aware of Medela Symphony rental available from gift shop.    Engorgement prevention and treatment reviewed.  Mom aware of OP lactation support available to her and encouraged her to call prn.   Lactation Tools Discussed/Used Tools: Pump;Bottle Breast pump type: Double-Electric Breast Pump Pump Education: Setup, frequency, and cleaning;Milk Storage Reason for Pumping: Support milk supply/Mom choosing to pump and bottle Pumping frequency: Encouraged double pumping 8-12 times per 24 hrs Pumped volume: 10 mL  Interventions Interventions: Breast feeding basics reviewed;Skin to skin;Breast massage;Hand express;DEBP;Education  Discharge Discharge Education: Engorgement and breast care;Warning signs for feeding baby;Outpatient recommendation Pump: Personal (Thinks it is a Medela DEBP)  Consult Status Consult Status: Complete (mother declined follow up) Date: 04/11/22 Follow-up type: Call as needed    Tina Dixon 04/11/2022, 10:11 AM

## 2022-04-17 ENCOUNTER — Ambulatory Visit: Payer: BC Managed Care – PPO

## 2022-04-18 ENCOUNTER — Encounter: Payer: BC Managed Care – PPO | Admitting: Obstetrics and Gynecology

## 2022-04-18 ENCOUNTER — Telehealth (HOSPITAL_COMMUNITY): Payer: Self-pay

## 2022-04-18 NOTE — Telephone Encounter (Signed)
  No answer. Left message to return nurse call.  Marcelino Duster [facility] 06/6//2023,1424

## 2022-04-25 ENCOUNTER — Encounter: Payer: BC Managed Care – PPO | Admitting: Obstetrics and Gynecology

## 2022-05-31 ENCOUNTER — Encounter: Payer: Self-pay | Admitting: Obstetrics and Gynecology

## 2022-05-31 ENCOUNTER — Ambulatory Visit (INDEPENDENT_AMBULATORY_CARE_PROVIDER_SITE_OTHER): Payer: BLUE CROSS/BLUE SHIELD | Admitting: Obstetrics and Gynecology

## 2022-05-31 ENCOUNTER — Other Ambulatory Visit: Payer: BLUE CROSS/BLUE SHIELD

## 2022-05-31 DIAGNOSIS — O24415 Gestational diabetes mellitus in pregnancy, controlled by oral hypoglycemic drugs: Secondary | ICD-10-CM

## 2022-05-31 NOTE — Progress Notes (Signed)
    Post Partum Visit Note  Tina Dixon is a 31 y.o. G1P1001 s/p 5/27 SVD/2nd degree and right peiruretheral at 37wks. She had an IOL for severe FGR, cHTN (no meds), and A2GDM (metformin).  I have fully reviewed the prenatal and intrapartum course. Anesthesia: epidural. Postpartum course has been uncomplicated. Baby is doing well. Baby is feeding by bottle only since about 2wks ago. Bleeding no bleeding. Bowel function is normal. Bladder function is normal. Patient is sexually active and no issues. Contraception method is rhythm method. Postpartum depression screening: negative.    Edinburgh Postnatal Depression Scale - 05/31/22 0902       Edinburgh Postnatal Depression Scale:  In the Past 7 Days   I have been able to laugh and see the funny side of things. 0    I have looked forward with enjoyment to things. 0    I have blamed myself unnecessarily when things went wrong. 2    I have been anxious or worried for no good reason. 2    I have felt scared or panicky for no good reason. 0    Things have been getting on top of me. 1    I have been so unhappy that I have had difficulty sleeping. 0    I have felt sad or miserable. 2    I have been so unhappy that I have been crying. 0    The thought of harming myself has occurred to me. 0    Edinburgh Postnatal Depression Scale Total 7             Review of Systems Pertinent items noted in HPI and remainder of comprehensive ROS otherwise negative.  Objective:  BP 129/86   Pulse 77   Ht 5\' 3"  (1.6 m)   Wt 235 lb 3.2 oz (106.7 kg)   Breastfeeding No   BMI 41.66 kg/m    General: NAD  Assessment:   Normal postpartum visit.   Plan:  *PP: routine care. Will do NFP for now and wants to try again next year. I d/w her to ideally wait 9-12 months before trying again to help decrease risks associated with SI pregnancy, FGR, etc. Diagnosis  Date Value Ref Range Status  10/19/2021   Final   - Negative for intraepithelial lesion or  malignancy (NILM)  *CHTN: on no meds before pregnancy and she is done with limited PP lasix course. Normal BPs. Recommend pt see PCP ([facility]) sometime in the next few months for routine primary care. *GDMa2: had food/drink this morning. Will have patient come back for 2h PP GTT *Mood: stable on pre pregnancy zoloft  14/05/2021, MD Center for [facility], [facility]

## 2022-06-05 ENCOUNTER — Other Ambulatory Visit: Payer: BLUE CROSS/BLUE SHIELD

## 2022-06-05 DIAGNOSIS — O24415 Gestational diabetes mellitus in pregnancy, controlled by oral hypoglycemic drugs: Secondary | ICD-10-CM | POA: Diagnosis not present

## 2022-06-06 LAB — GLUCOSE TOLERANCE, 2 HOURS
Glucose, 2 hour: 99 mg/dL (ref 70–139)
Glucose, GTT - Fasting: 99 mg/dL (ref 70–99)

## 2022-06-13 ENCOUNTER — Encounter: Payer: Self-pay | Admitting: Family

## 2022-07-13 ENCOUNTER — Ambulatory Visit: Payer: BLUE CROSS/BLUE SHIELD | Admitting: Family

## 2022-07-25 ENCOUNTER — Ambulatory Visit (INDEPENDENT_AMBULATORY_CARE_PROVIDER_SITE_OTHER): Payer: Self-pay | Admitting: Family

## 2022-07-25 ENCOUNTER — Encounter: Payer: Self-pay | Admitting: Family

## 2022-07-25 VITALS — BP 128/70 | HR 82 | Temp 98.6°F | Resp 16 | Ht 63.0 in | Wt 240.2 lb

## 2022-07-25 DIAGNOSIS — Z5181 Encounter for therapeutic drug level monitoring: Secondary | ICD-10-CM | POA: Insufficient documentation

## 2022-07-25 DIAGNOSIS — O24414 Gestational diabetes mellitus in pregnancy, insulin controlled: Secondary | ICD-10-CM | POA: Insufficient documentation

## 2022-07-25 DIAGNOSIS — Z8632 Personal history of gestational diabetes: Secondary | ICD-10-CM

## 2022-07-25 DIAGNOSIS — F32A Depression, unspecified: Secondary | ICD-10-CM

## 2022-07-25 DIAGNOSIS — O24419 Gestational diabetes mellitus in pregnancy, unspecified control: Secondary | ICD-10-CM | POA: Insufficient documentation

## 2022-07-25 DIAGNOSIS — E782 Mixed hyperlipidemia: Secondary | ICD-10-CM

## 2022-07-25 DIAGNOSIS — F419 Anxiety disorder, unspecified: Secondary | ICD-10-CM | POA: Insufficient documentation

## 2022-07-25 DIAGNOSIS — R635 Abnormal weight gain: Secondary | ICD-10-CM | POA: Insufficient documentation

## 2022-07-25 DIAGNOSIS — O24415 Gestational diabetes mellitus in pregnancy, controlled by oral hypoglycemic drugs: Secondary | ICD-10-CM

## 2022-07-25 DIAGNOSIS — O10919 Unspecified pre-existing hypertension complicating pregnancy, unspecified trimester: Secondary | ICD-10-CM

## 2022-07-25 DIAGNOSIS — Z23 Encounter for immunization: Secondary | ICD-10-CM | POA: Insufficient documentation

## 2022-07-25 DIAGNOSIS — E871 Hypo-osmolality and hyponatremia: Secondary | ICD-10-CM | POA: Insufficient documentation

## 2022-07-25 HISTORY — DX: Depression, unspecified: F32.A

## 2022-07-25 HISTORY — DX: Mixed hyperlipidemia: E78.2

## 2022-07-25 MED ORDER — SERTRALINE HCL 50 MG PO TABS: 50.0000 mg | ORAL_TABLET | Freq: Every day | ORAL | 3 refills | Status: DC

## 2022-07-25 NOTE — Assessment & Plan Note (Signed)
Flu  vaccine administered in office Pt tolerated procedure well  Verbal consent obtained prior to administration  Handout given in regards to vaccination.   

## 2022-07-25 NOTE — Assessment & Plan Note (Signed)
a1c ordered pending results.

## 2022-07-25 NOTE — Assessment & Plan Note (Signed)
Ordering ekg for possible phentermine start

## 2022-07-25 NOTE — Assessment & Plan Note (Signed)
Ordering a1c pending results.

## 2022-07-25 NOTE — Assessment & Plan Note (Signed)
Ordering cmp

## 2022-07-25 NOTE — Assessment & Plan Note (Signed)
Ordering tsh pending results 

## 2022-07-25 NOTE — Patient Instructions (Signed)
Start sertraline (Zoloft) 50 mg for anxiety and depression. Take 1/2 tablet by mouth once daily for about one week, then increase to 1 full tablet thereafter.   Taking the medicine as directed and not missing any doses is one of the best things you can do to treat your anxiety/depression.  Here are some things to keep in mind:  Side effects (stomach upset, some increased anxiety) may happen before you notice a benefit.  These side effects typically go away over time. Changes to your dose of medicine or a change in medication all together is sometimes necessary Many people will notice an improvement within two weeks but the full effect of the medication can take up to 4-6 weeks Stopping the medication when you start feeling better often results in a return of symptoms. Most people need to be on medication at least 6-12 months If you start having thoughts of hurting yourself or others after starting this medicine, please call me immediately.    Welcome to our clinic, I am happy to have you as my new patient. I am excited to continue on this healthcare journey with you.  Stop by the lab prior to leaving today. I will notify you of your results once received.   Please keep in mind Any my chart messages you send have up to a three business day turnaround for a response.  Phone calls may take up to a one full business day turnaround for a  response.   If you need a medication refill I recommend you request it through the pharmacy as this is easiest for [place] rather than sending a message and or phone call.   Due to recent changes in healthcare laws, you may see results of your imaging and/or laboratory studies on MyChart before I have had a chance to review them.  I understand that in some cases there may be results that are confusing or concerning to you. Please understand that not all results are received at the same time and often I may need to interpret multiple results in order to provide you with  the best plan of care or course of treatment. Therefore, I ask that you please give me 2 business days to thoroughly review all your results before contacting my office for clarification. Should we see a critical lab result, you will be contacted sooner.   It was a pleasure seeing you today! Please do not hesitate to reach out with any questions and or concerns.  Regards,   Mort Sawyers FNP-C

## 2022-07-25 NOTE — Assessment & Plan Note (Signed)
Ordered lipid panel, pending results. Work on low cholesterol diet and exercise as tolerated ? ?

## 2022-07-25 NOTE — Assessment & Plan Note (Signed)
I instructed pt to start zoloft 50 mg 1/2 tablet once daily for 1 week and then increase to a full tablet once daily on week two as tolerated.  We discussed common side effects such as nausea, drowsiness and weight gain.  Also discussed rare but serious side effect of suicidal ideation.  She is instructed to discontinue medication and go directly to ED if this occurs.  Pt verbalizes understanding.  Plan is to follow up in 30 days to evaluate progress.

## 2022-07-25 NOTE — Progress Notes (Signed)
New Patient Office Visit  Subjective:  Patient ID: Tina Dixon, female    DOB: Feb 20, 1991  Age: 31 y.o. MRN: [id number]  CC:  Chief Complaint  Patient presents with   Establish Care    HPI Tina Dixon is here to establish care as a new patient.  [facility]  Pt is without acute concerns.   Just gave birth in may with her daughter, Tina Dixon  Pap 10/19/21 negative   Intersected in weight loss options at this time. Hard to lose weight, has gained weight. Lost 40 pounds after birth of her daughter and feels as though she gained it back fast since the birth.  Wt Readings from Last 3 Encounters:  07/25/22 240 lb 4 oz (109 kg)  05/31/22 235 lb 3.2 oz (106.7 kg)  04/07/22 232 lb 4.8 oz (105.4 kg)    She had gestational diabetes during her pregnancy, hasn't been retested.  Tries to walk when she can, twice a weekly for thirty minutes at a time. 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  Diet: trying to focus on portion control and choosing better options. Has tried keto and weight watchers.  Just had a baby back in May 2022, no relief.   Anxiety depression: was on sertraline 100 mg and was tolerating well before baby, wanting to try again.     07/25/2022    2:29 PM 09/26/2021    2:12 PM  GAD 7 : Generalized Anxiety Score  Nervous, Anxious, on Edge 2 2  Control/stop worrying 2 2  Worry too much - different things 2 3  Trouble relaxing 1 1  Restless 3 3  Easily annoyed or irritable 2 2  Afraid - awful might happen 2 2  Total GAD 7 Score 14 15  Anxiety Difficulty Somewhat difficult Somewhat difficult       07/25/2022    2:28 PM 03/06/2022   10:12 AM 09/26/2021    2:11 PM  PHQ9 SCORE ONLY  PHQ-9 Total Score 17 0 19        Past Medical History:  Diagnosis Date   Anxiety    Depression    Gestational diabetes    Gestational diabetes mellitus, antepartum 02/08/2022    Past Surgical History:  Procedure Laterality  Date   WISDOM TOOTH EXTRACTION      Family History  Problem Relation Age of Onset   Hypertension Mother    Pancreatic cancer Father        pancreatic, passed at 32   Pancreatitis Father    Diabetes Father    Diabetes Maternal Grandmother    Hypertension Maternal Grandmother    Hypertension Maternal Grandfather    Heart disease Paternal Grandmother    Pancreatitis Paternal Grandfather     Social History   Socioeconomic History   Marital status: Married    Spouse name: Tina Dixon   Number of children: 1   Years of education: Not on file   Highest education level: Not on file  Occupational History   Occupation: Hair Stylist    Comment: hair by [name]    Employer: [employer]  Tobacco Use   Smoking status: Never   Smokeless tobacco: Never  Vaping Use   Vaping Use: Never used  Substance and Sexual Activity   Alcohol use: Yes    Comment: socially   Drug use: No   Sexual activity: Yes    Partners: Male    Comment: monitor during ovulation  cycle  Other Topics Concern   Not on file  Social History Narrative   ** Merged History Encounter **       Social Determinants of Health   Financial Resource Strain: Not on file  Food Insecurity: Not on file  Transportation Needs: Not on file  Physical Activity: Not on file  Stress: Not on file  Social Connections: Not on file  Intimate Partner Violence: Not on file    Outpatient Medications Prior to Visit  Medication Sig Dispense Refill   furosemide (LASIX) 20 MG tablet Take 1 tablet (20 mg total) by mouth daily. (Patient not taking: Reported on 05/31/2022) 5 tablet 0   ibuprofen (ADVIL) 600 MG tablet Take 1 tablet (600 mg total) by mouth every 6 (six) hours. (Patient not taking: Reported on 07/25/2022) 30 tablet 0   Prenatal MV & Min w/FA-DHA (PRENATAL GUMMIES PO) Take 2 tablets by mouth daily. (Patient not taking: Reported on 07/25/2022)     sertraline (ZOLOFT) 100 MG tablet Take 1 tablet (100 mg total) by mouth daily.  (Patient not taking: Reported on 07/25/2022) 30 tablet 3   No facility-administered medications prior to visit.    No Known Allergies  ROS Review of Systems  Constitutional:  Positive for unexpected weight change (some weight gain since birth of her daugther). Negative for chills and fatigue.  Respiratory:  Negative for cough and shortness of breath.   Cardiovascular:  Negative for chest pain and leg swelling.  Gastrointestinal:  Negative for diarrhea and nausea.  Genitourinary:  Negative for difficulty urinating, menstrual problem and vaginal bleeding.  Neurological:  Negative for dizziness and headaches.  Psychiatric/Behavioral:  Negative for agitation and sleep disturbance.   All other systems reviewed and are negative.       Objective:    Physical Exam Vitals reviewed.  Constitutional:      General: She is not in acute distress.    Appearance: Normal appearance. She is obese. She is not ill-appearing, toxic-appearing or diaphoretic.  HENT:     Right Ear: Tympanic membrane normal.     Left Ear: Tympanic membrane normal.     Mouth/Throat:     Mouth: Mucous membranes are moist.     Pharynx: No pharyngeal swelling.     Tonsils: No tonsillar exudate.  Eyes:     Extraocular Movements: Extraocular movements intact.     Conjunctiva/sclera: Conjunctivae normal.     Pupils: Pupils are equal, round, and reactive to light.  Neck:     Thyroid: No thyroid mass.  Cardiovascular:     Rate and Rhythm: Normal rate and regular rhythm.  Pulmonary:     Effort: Pulmonary effort is normal.     Breath sounds: Normal breath sounds.  Abdominal:     General: Abdomen is flat. Bowel sounds are normal.     Palpations: Abdomen is soft.  Musculoskeletal:        General: Normal range of motion.  Lymphadenopathy:     Cervical:     Right cervical: No superficial cervical adenopathy.    Left cervical: No superficial cervical adenopathy.  Skin:    General: Skin is warm.     Capillary Refill:  Capillary refill takes less than 2 seconds.  Neurological:     General: No focal deficit present.     Mental Status: She is alert and oriented to person, place, and time.  Psychiatric:        Mood and Affect: Mood normal.          Behavior: Behavior normal.        Thought Content: Thought content normal.        Judgment: Judgment normal.      BP 128/70   Pulse 82   Temp 98.6 F (37 C)   Resp 16   Ht 5' 3" (1.6 m)   Wt 240 lb 4 oz (109 kg)   LMP 07/06/2022   SpO2 98%   BMI 42.56 kg/m  Wt Readings from Last 3 Encounters:  07/25/22 240 lb 4 oz (109 kg)  05/31/22 235 lb 3.2 oz (106.7 kg)  04/07/22 232 lb 4.8 oz (105.4 kg)     Health Maintenance Due  Topic Date Due   URINE MICROALBUMIN  Never done    There are no preventive care reminders to display for this patient.  Lab Results  Component Value Date   TSH 1.240 10/19/2021   Lab Results  Component Value Date   WBC 14.0 (H) 04/07/2022   HGB 11.9 (L) 04/07/2022   HCT 35.7 (L) 04/07/2022   MCV 88.6 04/07/2022   PLT 363 04/07/2022   Lab Results  Component Value Date   NA 134 (L) 04/07/2022   K 3.5 04/07/2022   CO2 21 (L) 04/07/2022   GLUCOSE 98 04/07/2022   BUN <5 (L) 04/07/2022   CREATININE 0.49 04/07/2022   BILITOT 0.1 (L) 04/07/2022   ALKPHOS 132 (H) 04/07/2022   AST 17 04/07/2022   ALT 15 04/07/2022   PROT 6.7 04/07/2022   ALBUMIN 2.7 (L) 04/07/2022   CALCIUM 8.8 (L) 04/07/2022   ANIONGAP 10 04/07/2022   EGFR 130 04/04/2022   Lab Results  Component Value Date   CHOL 182 05/20/2018   Lab Results  Component Value Date   HDL 82 05/20/2018   Lab Results  Component Value Date   LDLCALC 68 05/20/2018   Lab Results  Component Value Date   TRIG 159 (H) 05/20/2018   No results found for: "CHOLHDL" Lab Results  Component Value Date   HGBA1C 5.4 10/19/2021      Assessment & Plan:   Problem List Items Addressed This Visit       Cardiovascular and Mediastinum   Chronic hypertension during  pregnancy    Controlled at todays visit.  Ordering ekg for possible use of phentermine for weight loss. nsr in office.      Relevant Orders   EKG 12-Lead (Completed)     Endocrine   RESOLVED: Gestational diabetes mellitus, antepartum    a1c ordered pending results.         Other   History of gestational diabetes - Primary    Ordering a1c pending results.       Relevant Orders   Hemoglobin A1c   EKG 12-Lead (Completed)   Hyponatremia    Ordering cmp       Relevant Orders   Comprehensive metabolic panel   Elevated triglycerides with high cholesterol    Ordered lipid panel, pending results. Work on low cholesterol diet and exercise as tolerated       Relevant Orders   Lipid panel   EKG 12-Lead (Completed)   Encounter for medication monitoring    Ordering ekg for possible phentermine start      Relevant Orders   EKG 12-Lead (Completed)   Abnormal weight gain    Ordering tsh pending results.       Relevant Orders   Comprehensive metabolic panel   TSH   EKG 12-Lead (Completed)   Anxiety and depression  I instructed pt to start zoloft 50 mg 1/2 tablet once daily for 1 week and then increase to a full tablet once daily on week two as tolerated.  We discussed common side effects such as nausea, drowsiness and weight gain.  Also discussed rare but serious side effect of suicidal ideation.  She is instructed to discontinue medication and go directly to ED if this occurs.  Pt verbalizes understanding.  Plan is to follow up in 30 days to evaluate progress.          Relevant Medications   sertraline (ZOLOFT) 50 MG tablet   Other Relevant Orders   Comprehensive metabolic panel   CBC with Differential/Platelet   EKG 12-Lead (Completed)   Need for influenza vaccination    Flu  vaccine administered in office Pt tolerated procedure well  Verbal consent obtained prior to administration  Handout given in regards to vaccination.         Relevant Orders   Flu  Vaccine QUAD 77moIM (Fluarix, Fluzone & Alfiuria Quad PF) (Completed)    Meds ordered this encounter  Medications   sertraline (ZOLOFT) 50 MG tablet    Sig: Take 1 tablet (50 mg total) by mouth daily.    Dispense:  30 tablet    Refill:  3    Order Specific Question:   Supervising Provider    Answer:   BDiona Browner AMY E [[[id number]]   Follow-up: Return in about 6 weeks (around 09/05/2022) for follow up on medication start .    TEugenia Pancoast FNP

## 2022-07-25 NOTE — Assessment & Plan Note (Signed)
Controlled at todays visit.  Ordering ekg for possible use of phentermine for weight loss. nsr in office.

## 2022-07-26 ENCOUNTER — Other Ambulatory Visit: Payer: Self-pay | Admitting: Family

## 2022-07-26 DIAGNOSIS — R635 Abnormal weight gain: Secondary | ICD-10-CM

## 2022-07-26 DIAGNOSIS — O24415 Gestational diabetes mellitus in pregnancy, controlled by oral hypoglycemic drugs: Secondary | ICD-10-CM | POA: Insufficient documentation

## 2022-07-26 LAB — LIPID PANEL
Cholesterol: 194 mg/dL (ref 0–200)
HDL: 56.1 mg/dL (ref >39.00)
LDL Cholesterol: 104 mg/dL — ABNORMAL HIGH (ref 0–99)
NonHDL: 137.96
Total CHOL/HDL Ratio: 3
Triglycerides: 168 mg/dL — ABNORMAL HIGH (ref 0.0–149.0)
VLDL: 33.6 mg/dL (ref 0.0–40.0)

## 2022-07-26 LAB — CBC WITH DIFFERENTIAL/PLATELET
Basophils Absolute: 0.1 10*3/uL (ref 0.0–0.1)
Basophils Relative: 0.9 % (ref 0.0–3.0)
Eosinophils Absolute: 0.2 10*3/uL (ref 0.0–0.7)
Eosinophils Relative: 2 % (ref 0.0–5.0)
HCT: 38.2 % (ref 36.0–46.0)
Hemoglobin: 12.8 g/dL (ref 12.0–15.0)
Lymphocytes Relative: 28.2 % (ref 12.0–46.0)
Lymphs Abs: 2.5 10*3/uL (ref 0.7–4.0)
MCHC: 33.6 g/dL (ref 30.0–36.0)
MCV: 91.9 fl (ref 78.0–100.0)
Monocytes Absolute: 0.8 10*3/uL (ref 0.1–1.0)
Monocytes Relative: 8.7 % (ref 3.0–12.0)
Neutro Abs: 5.3 10*3/uL (ref 1.4–7.7)
Neutrophils Relative %: 60.2 % (ref 43.0–77.0)
Platelets: 408 10*3/uL — ABNORMAL HIGH (ref 150.0–400.0)
RBC: 4.15 Mil/uL (ref 3.87–5.11)
RDW: 13.5 % (ref 11.5–15.5)
WBC: 8.8 10*3/uL (ref 4.0–10.5)

## 2022-07-26 LAB — COMPREHENSIVE METABOLIC PANEL
ALT: 16 U/L (ref 0–35)
AST: 15 U/L (ref 0–37)
Albumin: 4 g/dL (ref 3.5–5.2)
Alkaline Phosphatase: 63 U/L (ref 39–117)
BUN: 11 mg/dL (ref 6–23)
CO2: 23 mEq/L (ref 19–32)
Calcium: 8.9 mg/dL (ref 8.4–10.5)
Chloride: 103 mEq/L (ref 96–112)
Creatinine, Ser: 0.72 mg/dL (ref 0.40–1.20)
GFR: 112.09 mL/min (ref >60.00)
Glucose, Bld: 81 mg/dL (ref 70–99)
Potassium: 4.2 mEq/L (ref 3.5–5.1)
Sodium: 137 mEq/L (ref 135–145)
Total Bilirubin: 0.3 mg/dL (ref 0.2–1.2)
Total Protein: 7.3 g/dL (ref 6.0–8.3)

## 2022-07-26 LAB — TSH: TSH: 2.89 u[IU]/mL (ref 0.35–5.50)

## 2022-07-26 LAB — HEMOGLOBIN A1C: Hgb A1c MFr Bld: 5.4 % (ref 4.6–6.5)

## 2022-07-26 MED ORDER — PHENTERMINE HCL 15 MG PO CAPS: ORAL_CAPSULE | ORAL | 0 refills | Status: DC

## 2022-07-26 MED ORDER — PHENTERMINE HCL 37.5 MG PO CAPS: ORAL_CAPSULE | ORAL | 0 refills | Status: DC

## 2022-08-01 ENCOUNTER — Other Ambulatory Visit: Payer: Self-pay | Admitting: Family

## 2022-08-01 DIAGNOSIS — R635 Abnormal weight gain: Secondary | ICD-10-CM

## 2022-08-01 DIAGNOSIS — O24415 Gestational diabetes mellitus in pregnancy, controlled by oral hypoglycemic drugs: Secondary | ICD-10-CM

## 2022-08-03 ENCOUNTER — Encounter: Payer: Self-pay | Admitting: Family

## 2022-08-03 DIAGNOSIS — R635 Abnormal weight gain: Secondary | ICD-10-CM

## 2022-08-03 DIAGNOSIS — O24415 Gestational diabetes mellitus in pregnancy, controlled by oral hypoglycemic drugs: Secondary | ICD-10-CM

## 2022-08-04 MED ORDER — PHENTERMINE HCL 37.5 MG PO CAPS: ORAL_CAPSULE | ORAL | 0 refills | Status: DC

## 2022-08-07 ENCOUNTER — Telehealth: Payer: Self-pay

## 2022-08-07 DIAGNOSIS — O24415 Gestational diabetes mellitus in pregnancy, controlled by oral hypoglycemic drugs: Secondary | ICD-10-CM

## 2022-08-07 DIAGNOSIS — R635 Abnormal weight gain: Secondary | ICD-10-CM

## 2022-08-07 NOTE — Telephone Encounter (Signed)
Prior auth started for Phentermine HCl 37.5MG  capsules. [name] [facility] - Rx #: [id number] Waiting for determination.

## 2022-08-07 NOTE — Telephone Encounter (Signed)
Noted  

## 2022-08-08 NOTE — Telephone Encounter (Signed)
Prior auth for Phentermine HCl 37.5MG  capsules has been approved. [facility] - Rx #: [id number] Approved today Effective from 08/07/2022 through 08/06/2023. Patient notified via mychart.

## 2022-08-09 ENCOUNTER — Other Ambulatory Visit: Payer: Self-pay | Admitting: Family

## 2022-08-09 DIAGNOSIS — R635 Abnormal weight gain: Secondary | ICD-10-CM

## 2022-08-09 DIAGNOSIS — O24415 Gestational diabetes mellitus in pregnancy, controlled by oral hypoglycemic drugs: Secondary | ICD-10-CM

## 2022-08-09 MED ORDER — PHENTERMINE HCL 37.5 MG PO CAPS: ORAL_CAPSULE | ORAL | 0 refills | Status: DC

## 2022-08-09 NOTE — Addendum Note (Signed)
Addended by: Eugenia Pancoast on: 08/09/2022 12:53 PM   Modules accepted: Orders

## 2022-09-01 ENCOUNTER — Telehealth: Payer: Self-pay | Admitting: Family Medicine

## 2022-09-01 ENCOUNTER — Telehealth: Payer: Self-pay

## 2022-09-01 DIAGNOSIS — J069 Acute upper respiratory infection, unspecified: Secondary | ICD-10-CM

## 2022-09-01 MED ORDER — BENZONATATE 200 MG PO CAPS: 200.0000 mg | ORAL_CAPSULE | Freq: Two times a day (BID) | ORAL | 0 refills | Status: DC | PRN

## 2022-09-01 MED ORDER — FLUTICASONE PROPIONATE 50 MCG/ACT NA SUSP: 2.0000 | Freq: Every day | NASAL | 6 refills | Status: DC

## 2022-09-01 NOTE — Progress Notes (Signed)
Virtual Visit Consent   Tina Dixon, you are scheduled for a virtual visit with a [facility] provider today. Just as with appointments in the office, your consent must be obtained to participate. Your consent will be active for this visit and any virtual visit you may have with one of our providers in the next 365 days. If you have a MyChart account, a copy of this consent can be sent to you electronically.  As this is a virtual visit, video technology does not allow for your provider to perform a traditional examination. This may limit your provider's ability to fully assess your condition. If your provider identifies any concerns that need to be evaluated in person or the need to arrange testing (such as labs, EKG, etc.), we will make arrangements to do so. Although advances in technology are sophisticated, we cannot ensure that it will always work on either your end or our end. If the connection with a video visit is poor, the visit may have to be switched to a telephone visit. With either a video or telephone visit, we are not always able to ensure that we have a secure connection.  By engaging in this virtual visit, you consent to the provision of healthcare and authorize for your insurance to be billed (if applicable) for the services provided during this visit. Depending on your insurance coverage, you may receive a charge related to this service.  I need to obtain your verbal consent now. Are you willing to proceed with your visit today? Tina Dixon has provided verbal consent on 09/01/2022 for a virtual visit (video or telephone). Tina Curio, FNP  Date: 09/01/2022 10:50 AM  Virtual Visit via Video Note   I, Tina Dixon, connected with  Tina Dixon  ([id number], Dec 13, 1990) on 09/01/22 at 10:45 AM EDT by a video-enabled telemedicine application and verified that I am speaking with the correct person using two identifiers.  Location: Patient: Virtual Visit Location Patient:  Home Provider: Virtual Visit Location Provider: Home Office   I discussed the limitations of evaluation and management by telemedicine and the availability of in person appointments. The patient expressed understanding and agreed to proceed.    History of Present Illness: Tina Dixon is a 31 y.o. who identifies as a female who was assigned female at birth, and is being seen today for sinus pressure, post nasal drainage, loss of voice, and mild cough since yesterday. No fever, no chills, no wheezing, no sob, in home covid test negative. Marland Kitchen  HPI: HPI  Problems:  Patient Active Problem List   Diagnosis Date Noted   Morbid obesity with body mass index (BMI) of 40.0 to 44.9 in adult [facility]) 07/26/2022   Gestational diabetes mellitus (GDM) controlled on oral hypoglycemic drug, antepartum 07/26/2022   History of gestational diabetes 07/25/2022   Hyponatremia 07/25/2022   Elevated triglycerides with high cholesterol 07/25/2022   Encounter for medication monitoring 07/25/2022   Abnormal weight gain 07/25/2022   Anxiety and depression 07/25/2022   Need for influenza vaccination 07/25/2022   Maternal morbid obesity, antepartum (HCC) 10/19/2021   Chronic hypertension during pregnancy 10/19/2021    Allergies: No Known Allergies Medications:  Current Outpatient Medications:    benzonatate (TESSALON) 200 MG capsule, Take 1 capsule (200 mg total) by mouth 2 (two) times daily as needed for cough., Disp: 20 capsule, Rfl: 0   fluticasone (FLONASE) 50 MCG/ACT nasal spray, Place 2 sprays into both nostrils daily., Disp: 16 g, Rfl: 6   phentermine 37.5 MG capsule,  Take on capsule po qd, Disp: 30 capsule, Rfl: 0   sertraline (ZOLOFT) 50 MG tablet, Take 1 tablet (50 mg total) by mouth daily., Disp: 30 tablet, Rfl: 3  Observations/Objective: Patient is well-developed, well-nourished in no acute distress.  Resting comfortably  at home.  Head is normocephalic, atraumatic.  No labored breathing.  Speech is  clear and coherent with logical content.  Patient is alert and oriented at baseline.    Assessment and Plan: 1. Viral URI  Increase fluids, humidifier at night, discussed viral vs bacterial infections, urgent care if sx persist or worsen.   Follow Up Instructions: I discussed the assessment and treatment plan with the patient. The patient was provided an opportunity to ask questions and all were answered. The patient agreed with the plan and demonstrated an understanding of the instructions.  A copy of instructions were sent to the patient via MyChart unless otherwise noted below.     The patient was advised to call back or seek an in-person evaluation if the symptoms worsen or if the condition fails to improve as anticipated.  Time:  I spent 10 minutes with the patient via telehealth technology discussing the above problems/concerns.    Dellia Nims, FNP

## 2022-09-01 NOTE — Patient Instructions (Signed)

## 2022-09-11 ENCOUNTER — Ambulatory Visit (INDEPENDENT_AMBULATORY_CARE_PROVIDER_SITE_OTHER): Payer: BC Managed Care – PPO | Admitting: Family

## 2022-09-11 ENCOUNTER — Encounter: Payer: Self-pay | Admitting: Family

## 2022-09-11 VITALS — BP 118/66 | HR 84 | Temp 98.0°F | Resp 16 | Ht 63.0 in | Wt 231.0 lb

## 2022-09-11 DIAGNOSIS — O24415 Gestational diabetes mellitus in pregnancy, controlled by oral hypoglycemic drugs: Secondary | ICD-10-CM

## 2022-09-11 DIAGNOSIS — F32A Depression, unspecified: Secondary | ICD-10-CM

## 2022-09-11 DIAGNOSIS — R635 Abnormal weight gain: Secondary | ICD-10-CM | POA: Diagnosis not present

## 2022-09-11 DIAGNOSIS — E66813 Obesity, class 3: Secondary | ICD-10-CM | POA: Insufficient documentation

## 2022-09-11 DIAGNOSIS — F419 Anxiety disorder, unspecified: Secondary | ICD-10-CM

## 2022-09-11 DIAGNOSIS — Z6841 Body Mass Index (BMI) 40.0 and over, adult: Secondary | ICD-10-CM

## 2022-09-11 MED ORDER — SERTRALINE HCL 50 MG PO TABS: 50.0000 mg | ORAL_TABLET | Freq: Every day | ORAL | 3 refills | Status: DC

## 2022-09-11 MED ORDER — PHENTERMINE HCL 37.5 MG PO CAPS: ORAL_CAPSULE | ORAL | 0 refills | Status: DC

## 2022-09-11 NOTE — Assessment & Plan Note (Signed)
Continue sertraline 50 mg once daily

## 2022-09-11 NOTE — Assessment & Plan Note (Signed)
pdmp reviewed Work on diet and exercise as tolerated  Refill phentermine 37.5 mg once daily

## 2022-09-11 NOTE — Patient Instructions (Signed)
Budget-Friendly Healthy Eating There are many ways to save money at the grocery store and continue to eat healthy. You can be successful if you: Plan meals according to your budget. Make a grocery list and only purchase food according to your grocery list. Prepare food yourself at home. What are tips for following this plan? Reading food labels Compare food labels between brand name foods and the store brand. Often the nutritional value is the same, but the store brand is lower cost. Look for products that do not have added sugar, fat, or salt (sodium). These often cost the same but are healthier for you. Products may be labeled as: Sugar-free. Nonfat. Low-fat. Sodium-free. Low-sodium. Look for lean ground beef labeled as at least 92% lean and 8% fat. Shopping  Buy only the items on your grocery list and go only to the areas of the store that have the items on your list. Use coupons only for foods and brands you normally buy. Avoid buying items you wouldn't normally buy simply because they are on sale. Check online and in newspapers for weekly deals. Buy healthy items from the bulk bins when available, such as herbs, spices, flour, pasta, nuts, and dried fruit. Buy fruits and vegetables that are in season. Prices are usually lower on in-season produce. Look at the unit price on the price tag. Use it to compare different brands and sizes to find out which item is the best deal. Choose healthy items that are often low-cost, such as carrots, potatoes, apples, bananas, and oranges. Dried or canned beans are a low-cost protein source. Buy in bulk and freeze extra food. Items you can buy in bulk include meats, fish, poultry, frozen fruits, and frozen vegetables. Avoid buying "ready-to-eat" foods, such as pre-cut fruits and vegetables and pre-made salads. If possible, shop around to discover where you can find the best prices. Consider other retailers such as dollar stores, larger wholesale  stores, local fruit and vegetable stands, and farmers markets. Do not shop when you are hungry. If you shop while hungry, it may be hard to stick to your list and budget. Resist impulse buying. Use your grocery list as your official plan for the week. Buy a variety of vegetables and fruits by purchasing fresh, frozen, and canned items. Look at the top and bottom shelves for deals. Foods at eye level (eye level of an adult or child) are usually more expensive. Be efficient with your time when shopping. The more time you spend at the store, the more money you are likely to spend. To save money when choosing more expensive foods like meats and dairy: Choose cheaper cuts of meat, such as bone-in chicken thighs and drumsticks instead of skinless and boneless chicken. When you are ready to prepare the chicken, you can remove the skin yourself to make it healthier. Choose lean meats like chicken or turkey instead of beef. Choose canned seafood, such as tuna, salmon, or sardines. Buy eggs as a low-cost source of protein. Buy dried beans and peas, such as lentils, split peas, or kidney beans instead of meats. Dried beans and peas are a good alternative source of protein. Buy the larger tubs of yogurt instead of individual-sized containers. Choose water instead of sodas and other sweetened beverages. Avoid buying chips, cookies, and other "junk food." These items are usually expensive and not healthy. Cooking Make extra food and freeze the extras in meal-sized containers or in individual portions for fast meals and snacks. Pre-cook on days when you have   extra time to prepare meals in advance. You can keep these meals in the fridge or freezer and reheat for a quick meal. When you come home from the grocery store, wash, peel, and cut fruits and vegetables so they are ready to use and eat. This will help reduce food waste. Meal planning Do not eat out or get fast food. Prepare food at home. Make a grocery  list and make sure to bring it with you to the store. If you have a smart phone, you could use your phone to create your shopping list. Plan meals and snacks according to a grocery list and budget you create. Use leftovers in your meal plan for the week. Look for recipes where you can cook once and make enough food for two meals. Prepare budget-friendly types of meals like stews, casseroles, and stir-fry dishes. Try some meatless meals or try "no cook" meals like salads. Make sure that half your plate is filled with fruits or vegetables. Choose from fresh, frozen, or canned fruits and vegetables. If eating canned, remember to rinse them before eating. This will remove any excess salt added for packaging. Summary Eating healthy on a budget is possible if you plan your meals according to your budget, purchase according to your budget and grocery list, and prepare food yourself. Tips for buying more food on a limited budget include buying generic brands, using coupons only for foods you normally buy, and buying healthy items from the bulk bins when available. Tips for buying cheaper food to replace expensive food include choosing cheaper, lean cuts of meat, and buying dried beans and peas. This information is not intended to replace advice given to you by your health care provider. Make sure you discuss any questions you have with your health care provider. Document Revised: 08/12/2020 Document Reviewed: 08/12/2020 Elsevier Patient Education  [employer].  ------------------------------------ Exercising to [employer] Getting regular exercise is important for everyone. It is especially important if you are overweight. Being overweight increases your risk of heart disease, stroke, diabetes, high blood pressure, and several types of cancer. Exercising, and reducing the calories you consume, can help you lose weight and improve fitness and health. Exercise can be moderate or vigorous intensity. To  lose weight, most people need to do a certain amount of moderate or vigorous-intensity exercise each week. How can exercise affect me? You lose weight when you exercise enough to burn more calories than you eat. Exercise also reduces body fat and builds muscle. The more muscle you have, the more calories you burn. Exercise also: Improves mood. Reduces stress and tension. Improves your overall fitness, flexibility, and endurance. Increases bone strength. Moderate-intensity exercise  Moderate-intensity exercise is any activity that gets you moving enough to burn at least three times more energy (calories) than if you were sitting. Examples of moderate exercise include: Walking a mile in 15 minutes. Doing light yard work. Biking at an easy pace. Most people should get at least 150 minutes of moderate-intensity exercise a week to maintain their body weight. Vigorous-intensity exercise Vigorous-intensity exercise is any activity that gets you moving enough to burn at least six times more calories than if you were sitting. When you exercise at this intensity, you should be working hard enough that you are not able to carry on a conversation. Examples of vigorous exercise include: Running. Playing a team sport, such as football, basketball, and soccer. Jumping rope. Most people should get at least 75 minutes a week of vigorous exercise  maintain their body weight. What actions can I take to lose weight? The amount of exercise you need to lose weight depends on: Your age. The type of exercise. Any health conditions you have. Your overall physical ability. Talk to your health care provider about how much exercise you need and what types of activities are safe for you. Nutrition  Make changes to your diet as told by your health care provider or diet and nutrition specialist (dietitian). This may include: Eating fewer calories. Eating more protein. Eating less unhealthy fats. Eating a diet that includes fresh fruits and  vegetables, whole grains, low-fat dairy products, and lean protein. Avoiding foods with added fat, salt, and sugar. Drink plenty of water while you exercise to prevent dehydration or heat stroke. Activity Choose an activity that you enjoy and set realistic goals. Your health care provider can help you make an exercise plan that works for you. Exercise at a moderate or vigorous intensity most days of the week. The intensity of exercise may vary from person to person. You can tell how intense a workout is for you by paying attention to your breathing and heartbeat. Most people will notice their breathing and heartbeat get faster with more intense exercise. Do resistance training twice each week, such as: Push-ups. Sit-ups. Lifting weights. Using resistance bands. Getting short amounts of exercise can be just as helpful as long, structured periods of exercise. If you have trouble finding time to exercise, try doing these things as part of your daily routine: Get up, stretch, and walk around every 30 minutes throughout the day. Go for a walk during your lunch break. Park your car farther away from your destination. If you take public transportation, get off one stop early and walk the rest of the way. Make phone calls while standing up and walking around. Take the stairs instead of elevators or escalators. Wear comfortable clothes and shoes with good support. Do not exercise so much that you hurt yourself, feel dizzy, or get very short of breath. Where to find more information [place] Department of Health and Human Services: www.hhs.gov Centers for Disease Control and Prevention: www.cdc.gov Contact a health care provider: Before starting a new exercise program. If you have questions or concerns about your weight. If you have a medical problem that keeps you from exercising. Get help right away if: You have any of the following while exercising: Injury. Dizziness. Difficulty breathing or  shortness of breath that does not go away when you stop exercising. Chest pain. Rapid heartbeat. These symptoms may represent a serious problem that is an emergency. Do not wait to see if the symptoms will go away. Get medical help right away. Call your local emergency services (911 in the [place] Do not drive yourself to the hospital. Summary Getting regular exercise is especially important if you are overweight. Being overweight increases your risk of heart disease, stroke, diabetes, high blood pressure, and several types of cancer. Losing weight happens when you burn more calories than you eat. Reducing the amount of calories you eat, and getting regular moderate or vigorous exercise each week, helps you lose weight. This information is not intended to replace advice given to you by your health care provider. Make sure you discuss any questions you have with your health care provider. Document Revised: 12/26/2020 Document Reviewed: 12/26/2020 Elsevier Patient Education  2023 Elsevier Inc.  

## 2022-09-11 NOTE — Progress Notes (Signed)
Established Patient Office Visit  Subjective:  Patient ID: Tina Dixon, female    DOB: May 27, 1991  Age: 31 y.o. MRN: [id number]  CC:  Chief Complaint  Patient presents with   Medication Consultation    HPI Tina Dixon is here today for follow up.   Pt is with acute concerns.  Anxiety/depression: doing really well with zoloft 50 mg states she is happy with this dosage.      09/11/2022   10:05 AM 07/25/2022    2:28 PM 03/06/2022   10:12 AM  PHQ9 SCORE ONLY  PHQ-9 Total Score 3 17 0      09/11/2022   10:06 AM 07/25/2022    2:29 PM 09/26/2021    2:12 PM  GAD 7 : Generalized Anxiety Score  Nervous, Anxious, on Edge 0 2 2  Control/stop worrying _0 Worry too much - different things _1 Trouble relaxing 0 1 1  Restless 0 3 3  Easily annoyed or irritable _2 Afraid - awful might happen _3 Total GAD 7 Score _4 Anxiety Difficulty Not difficult at all Somewhat difficult Somewhat difficult    Obesity: weight loss desire: doing well with phentermine. She is currently on 37.5 no cp palp or sob. Starting to walk a few times a week  Wt Readings from Last 3 Encounters:  09/11/22 231 lb (104.8 kg)  07/25/22 240 lb 4 oz (109 kg)  05/31/22 235 lb 3.2 oz (106.7 kg)     Past Medical History:  Diagnosis Date   Anxiety    Depression    Gestational diabetes    Gestational diabetes mellitus, antepartum 02/08/2022    Past Surgical History:  Procedure Laterality Date   WISDOM TOOTH EXTRACTION      Family History  Problem Relation Age of Onset   Hypertension Mother    Pancreatic cancer Father        pancreatic, passed at 16   Pancreatitis Father    Diabetes Father    Diabetes Maternal Grandmother    Hypertension Maternal Grandmother    Hypertension Maternal Grandfather    Heart disease Paternal Grandmother    Pancreatitis Paternal Grandfather     Social History   Socioeconomic History   Marital status: Married    Spouse name: Gaspar Bidding   Number of  children: 1   Years of education: Not on file   Highest education level: Not on file  Occupational History   Occupation: Hair Stylist    Comment: hair by [name]    Employer: [employer]  Tobacco Use   Smoking status: Never   Smokeless tobacco: Never  Vaping Use   Vaping Use: Never used  Substance and Sexual Activity   Alcohol use: Yes    Comment: socially   Drug use: No   Sexual activity: Yes    Partners: Male    Comment: monitor during ovulation cycle  Other Topics Concern   Not on file  Social History Narrative   ** Merged History Encounter **       Social Determinants of Health   Financial Resource Strain: Not on file  Food Insecurity: Not on file  Transportation Needs: Not on file  Physical Activity: Not on file  Stress: Not on file  Social Connections: Not on file  Intimate Partner Violence: Not on file    Outpatient Medications Prior to Visit  Medication Sig Dispense Refill   fluticasone (FLONASE) 50 MCG/ACT  nasal spray Place 2 sprays into both nostrils daily. 16 g 6   phentermine 37.5 MG capsule Take on capsule po qd 30 capsule 0   sertraline (ZOLOFT) 50 MG tablet Take 1 tablet (50 mg total) by mouth daily. 30 tablet 3   benzonatate (TESSALON) 200 MG capsule Take 1 capsule (200 mg total) by mouth 2 (two) times daily as needed for cough. (Patient not taking: Reported on 09/11/2022) 20 capsule 0   No facility-administered medications prior to visit.    No Known Allergies       Objective:    Physical Exam Constitutional:      General: She is not in acute distress.    Appearance: Normal appearance. She is obese. She is not ill-appearing, toxic-appearing or diaphoretic.  Cardiovascular:     Rate and Rhythm: Normal rate and regular rhythm.  Pulmonary:     Effort: Pulmonary effort is normal.  Neurological:     General: No focal deficit present.     Mental Status: She is alert and oriented to person, place, and time. Mental status is at  baseline.  Psychiatric:        Mood and Affect: Mood normal.        Behavior: Behavior normal.        Thought Content: Thought content normal.        Judgment: Judgment normal.       BP 118/66   Pulse 84   Temp 98 F (36.7 C)   Resp 16   Ht 5' 3" (1.6 m)   Wt 231 lb (104.8 kg)   LMP 09/03/2022   SpO2 98%   Breastfeeding No   BMI 40.92 kg/m  Wt Readings from Last 3 Encounters:  09/11/22 231 lb (104.8 kg)  07/25/22 240 lb 4 oz (109 kg)  05/31/22 235 lb 3.2 oz (106.7 kg)     Health Maintenance Due  Topic Date Due   COVID-19 Vaccine (3 - Pfizer series) 05/12/2020    There are no preventive care reminders to display for this patient.  Lab Results  Component Value Date   TSH 2.89 07/25/2022   Lab Results  Component Value Date   WBC 8.8 07/25/2022   HGB 12.8 07/25/2022   HCT 38.2 07/25/2022   MCV 91.9 07/25/2022   PLT 408.0 (H) 07/25/2022   Lab Results  Component Value Date   NA 137 07/25/2022   K 4.2 07/25/2022   CO2 23 07/25/2022   GLUCOSE 81 07/25/2022   BUN 11 07/25/2022   CREATININE 0.72 07/25/2022   BILITOT 0.3 07/25/2022   ALKPHOS 63 07/25/2022   AST 15 07/25/2022   ALT 16 07/25/2022   PROT 7.3 07/25/2022   ALBUMIN 4.0 07/25/2022   CALCIUM 8.9 07/25/2022   ANIONGAP 10 04/07/2022   EGFR 130 04/04/2022   GFR 112.09 07/25/2022   Lab Results  Component Value Date   CHOL 194 07/25/2022   Lab Results  Component Value Date   HDL 56.10 07/25/2022   Lab Results  Component Value Date   LDLCALC 104 (H) 07/25/2022   Lab Results  Component Value Date   TRIG 168.0 (H) 07/25/2022   Lab Results  Component Value Date   CHOLHDL 3 07/25/2022   Lab Results  Component Value Date   HGBA1C 5.4 07/25/2022      Assessment & Plan:   Problem List Items Addressed This Visit       Endocrine   RESOLVED: Gestational diabetes mellitus (GDM) controlled on oral  hypoglycemic drug, antepartum   Relevant Medications   phentermine 37.5 MG capsule      Other   Anxiety and depression    Continue sertraline 50 mg once daily       Relevant Medications   sertraline (ZOLOFT) 50 MG tablet   Morbid obesity with body mass index (BMI) of 40.0 to 44.9 in adult [facility])   Relevant Medications   phentermine 37.5 MG capsule   Class 3 severe obesity due to excess calories without serious comorbidity with body mass index (BMI) of 40.0 to 44.9 in adult [facility]) - Primary    pdmp reviewed Work on diet and exercise as tolerated  Refill phentermine 37.5 mg once daily       Relevant Medications   phentermine 37.5 MG capsule   RESOLVED: Abnormal weight gain   Relevant Medications   phentermine 37.5 MG capsule    Meds ordered this encounter  Medications   phentermine 37.5 MG capsule    Sig: Take on capsule po qd    Dispense:  30 capsule    Refill:  0    Order Specific Question:   Supervising Provider    Answer:   BEDSOLE, AMY E [[id number]]   sertraline (ZOLOFT) 50 MG tablet    Sig: Take 1 tablet (50 mg total) by mouth daily.    Dispense:  90 tablet    Refill:  3    Order Specific Question:   Supervising Provider    Answer:   Diona Browner, AMY E [[id number]]    Follow-up: Return in about 1 month (around 10/12/2022) for f/u weight loss medication.    Eugenia Pancoast, FNP

## 2022-10-11 ENCOUNTER — Ambulatory Visit: Payer: BC Managed Care – PPO | Admitting: Family

## 2022-10-25 ENCOUNTER — Ambulatory Visit: Payer: BC Managed Care – PPO | Admitting: Family

## 2022-11-13 NOTE — L&D Delivery Note (Signed)
OB/GYN Faculty Practice Delivery Note  Tina Dixon is a 32 y.o. G2P1001 s/p SVD at [redacted]w[redacted]d. She was admitted for IOL 2/2 CHTN, A2GDM.   ROM: 5h 12m with clear fluid GBS Status:  Negative/-- (08/28 1100) Maximum Maternal Temperature: 99.11F  Labor Progress: Initial SVE: 3/80/-3. She then progressed to complete.   Delivery Date/Time: 9/2 @ [id number] Delivery: Called to room and patient was complete and pushing. Head delivered direct OP with compound R arm. No nuchal cord present. Shoulder and body delivered in usual fashion. Infant with spontaneous cry, placed on mother's abdomen, dried and stimulated. Cord clamped x 2 after 30sec delay 2/2 non-reassuring fetal status. However, by time cord cut by FOB baby had spontaneous cry. Cord blood drawn, no gas was drawn give reassuring fetal status at time of cord clapping. Placenta delivered spontaneously with gentle cord traction. Fundus firm with massage and Pitocin. Labia, perineum, vagina, and cervix inspected with 2nd degree laceration repaired in usual running fashion. Mom doing well.  Baby boy then requiring CPAP for increased WOB / retractions, being admitted to NICU.   Baby Weight: pending  Placenta: 3 vessel, intact. Sent to L&D Complications: None Lacerations: as above EBL: 160 mL Analgesia: Epidural   Infant:  APGAR (1 MIN): 7  APGAR (5 MINS): 8   [name], MD [facility] Fellow, [facility] for [facility] 07/16/2023, 5:34 AM

## 2022-11-20 ENCOUNTER — Ambulatory Visit: Payer: BC Managed Care – PPO | Admitting: Family

## 2022-12-04 ENCOUNTER — Ambulatory Visit: Payer: BC Managed Care – PPO | Admitting: Family

## 2022-12-13 ENCOUNTER — Ambulatory Visit: Payer: BC Managed Care – PPO | Admitting: Family

## 2022-12-22 ENCOUNTER — Ambulatory Visit (INDEPENDENT_AMBULATORY_CARE_PROVIDER_SITE_OTHER): Payer: BC Managed Care – PPO | Admitting: *Deleted

## 2022-12-22 ENCOUNTER — Ambulatory Visit (INDEPENDENT_AMBULATORY_CARE_PROVIDER_SITE_OTHER): Payer: BC Managed Care – PPO

## 2022-12-22 VITALS — BP 132/83 | HR 90 | Wt 237.0 lb

## 2022-12-22 DIAGNOSIS — O3680X0 Pregnancy with inconclusive fetal viability, not applicable or unspecified: Secondary | ICD-10-CM

## 2022-12-22 DIAGNOSIS — Z3481 Encounter for supervision of other normal pregnancy, first trimester: Secondary | ICD-10-CM

## 2022-12-22 DIAGNOSIS — O0990 Supervision of high risk pregnancy, unspecified, unspecified trimester: Secondary | ICD-10-CM | POA: Insufficient documentation

## 2022-12-22 DIAGNOSIS — O09891 Supervision of other high risk pregnancies, first trimester: Secondary | ICD-10-CM | POA: Insufficient documentation

## 2022-12-22 DIAGNOSIS — Z3A08 8 weeks gestation of pregnancy: Secondary | ICD-10-CM

## 2022-12-22 DIAGNOSIS — Z3A01 Less than 8 weeks gestation of pregnancy: Secondary | ICD-10-CM | POA: Diagnosis not present

## 2022-12-22 NOTE — Progress Notes (Signed)
New OB Intake  I explained I am completing New OB Intake today. We discussed her EDD of 08/05/23 that is based on LMP. LMP of 10/29/22. Pt is G2/P1. I reviewed her allergies, medications, Medical/Surgical/OB history, and appropriate screenings.   Patient Active Problem List   Diagnosis Date Noted   Supervision of high risk pregnancy, antepartum 12/22/2022   Class 3 severe obesity due to excess calories without serious comorbidity with body mass index (BMI) of 40.0 to 44.9 in adult [facility]) 09/11/2022   Morbid obesity with body mass index (BMI) of 40.0 to 44.9 in adult [facility]) 07/26/2022   History of gestational diabetes 07/25/2022   Anxiety and depression 07/25/2022    Concerns addressed today  Delivery Plans:  Plans to deliver at [facility].   MyChart/Babyscripts MyChart access verified. I explained pt will have some visits in office and some virtually.    Blood Pressure Cuff  BP cuff  pt already has from previous pregnancy  Discussed to be used for virtual visits and or if needed BP checks weekly.  Advised pt due to her history to start checking her BP weekly.    Anatomy US Explained first scheduled [place] will be around 19 weeks.   Labs Pt had Horizon with last pregnancy. Would like  Panorama drawn at new OB visit. Routine prenatal labs needed.  Pt aware she will need early glucose test as well.   Placed OB Box on problem list and updated   Patient informed that the ultrasound is considered a limited obstetric ultrasound and is not intended to be a complete ultrasound exam.  Patient also informed that the ultrasound is not being completed with the intent of assessing for fetal or placental anomalies or any pelvic abnormalities. Explained that the purpose of today's ultrasound is to assess for dating and fetal heart rate.  Patient acknowledges the purpose of the exam and the limitations of the study.      First visit review I reviewed new OB appt with pt. I explained she will have ob  bloodwork with genetic screening Explained pt will be seen by Dr Kennon Rounds at first visit.    Crosby Oyster, RN 12/22/2022  9:47 AM

## 2023-01-24 ENCOUNTER — Encounter: Payer: Self-pay | Admitting: Family Medicine

## 2023-01-24 ENCOUNTER — Ambulatory Visit (INDEPENDENT_AMBULATORY_CARE_PROVIDER_SITE_OTHER): Payer: BC Managed Care – PPO | Admitting: Family Medicine

## 2023-01-24 ENCOUNTER — Other Ambulatory Visit (HOSPITAL_COMMUNITY)
Admission: RE | Admit: 2023-01-24 | Discharge: 2023-01-24 | Disposition: A | Discharge status: Home / Self Care | Payer: BC Managed Care – PPO | Source: Ambulatory Visit | Attending: Family Medicine | Admitting: Family Medicine

## 2023-01-24 VITALS — BP 139/91 | HR 102 | Wt 234.0 lb

## 2023-01-24 DIAGNOSIS — O162 Unspecified maternal hypertension, second trimester: Secondary | ICD-10-CM | POA: Diagnosis not present

## 2023-01-24 DIAGNOSIS — O161 Unspecified maternal hypertension, first trimester: Secondary | ICD-10-CM | POA: Diagnosis not present

## 2023-01-24 DIAGNOSIS — Z3A12 12 weeks gestation of pregnancy: Secondary | ICD-10-CM | POA: Diagnosis not present

## 2023-01-24 DIAGNOSIS — Z1339 Encounter for screening examination for other mental health and behavioral disorders: Secondary | ICD-10-CM

## 2023-01-24 DIAGNOSIS — O09891 Supervision of other high risk pregnancies, first trimester: Secondary | ICD-10-CM | POA: Diagnosis not present

## 2023-01-24 DIAGNOSIS — Z8632 Personal history of gestational diabetes: Secondary | ICD-10-CM

## 2023-01-24 DIAGNOSIS — O0990 Supervision of high risk pregnancy, unspecified, unspecified trimester: Secondary | ICD-10-CM | POA: Diagnosis not present

## 2023-01-24 DIAGNOSIS — O99341 Other mental disorders complicating pregnancy, first trimester: Secondary | ICD-10-CM

## 2023-01-24 DIAGNOSIS — F32A Depression, unspecified: Secondary | ICD-10-CM

## 2023-01-24 MED ORDER — ASPIRIN 81 MG PO CHEW: 81.0000 mg | CHEWABLE_TABLET | Freq: Every day | ORAL | 0 refills | Status: DC

## 2023-01-24 NOTE — Progress Notes (Signed)
CC: nausea

## 2023-01-24 NOTE — Progress Notes (Signed)
Subjective:   Tina Dixon is a 32 y.o. G2P1001 at 62w3dby LMP, early ultrasound being seen today for her first obstetrical visit.  Her obstetrical history is significant for obesity and CHTN, h/o GDM .  Pregnancy history fully reviewed.  Patient reports no complaints.  HISTORY: OB History  Gravida Para Term Preterm AB Living  '2 1 1 '$ 0 0 1  SAB IAB Ectopic Multiple Live Births  0 0 0 0 1    # Outcome Date GA Lbr Len/2nd Weight Sex Delivery Anes PTL Lv  2 Current           1 Term 04/07/22 374w1d 01:30 5 lb 5.7 oz (2.43 kg) F Vag-Spont EPI  LIV     Birth Comments: wnl     Name: Tina Dixon   Apgar1: 8  Apgar5: 9   Last pap smear was  10/19/2021 and was normal Past Medical History:  Diagnosis Date   Anxiety    Depression    Elevated triglycerides with high cholesterol 07/25/2022   Gestational diabetes    Gestational diabetes mellitus, antepartum 02/08/2022   Past Surgical History:  Procedure Laterality Date   WISDOM TOOTH EXTRACTION     Family History  Problem Relation Age of Onset   Hypertension Mother    Pancreatic cancer Father        pancreatic, passed at 4317 Pancreatitis Father    Diabetes Father    Diabetes Maternal Grandmother    Hypertension Maternal Grandmother    Hypertension Maternal Grandfather    Heart disease Paternal Grandmother    Pancreatitis Paternal Grandfather    Social History   Tobacco Use   Smoking status: Never   Smokeless tobacco: Never  Vaping Use   Vaping Use: Never used  Substance Use Topics   Alcohol use: Yes    Comment: socially   Drug use: No   No Known Allergies Current Outpatient Medications on File Prior to Visit  Medication Sig Dispense Refill   fluticasone (FLONASE) 50 MCG/ACT nasal spray Place 2 sprays into both nostrils daily. 16 g 6   sertraline (ZOLOFT) 50 MG tablet Take 1 tablet (50 mg total) by mouth daily. 90 tablet 3   No current facility-administered medications on file prior to visit.      Exam   Vitals:   01/24/23 1501  BP: (!) 139/91  Pulse: (!) 102  Weight: 234 lb (106.1 kg)   Fetal Heart Rate (bpm): 158  System: General: well-developed, well-nourished female in no acute distress   Skin: normal coloration and turgor, no rashes   Neurologic: oriented, normal, negative, normal mood   Extremities: normal strength, tone, and muscle mass, ROM of all joints is normal   HEENT PERRLA, extraocular movement intact and sclera clear, anicteric   Mouth/Teeth mucous membranes moist, pharynx normal without lesions and dental hygiene good   Neck supple and no masses   Cardiovascular: regular rate and rhythm   Respiratory:  no respiratory distress, normal effort   Abdomen: soft, non-tender; bowel sounds normal; no masses,  no organomegaly     Assessment:   Pregnancy: G2P1001 Patient Active Problem List   Diagnosis Date Noted   Supervision of high risk pregnancy, antepartum 12/22/2022   Short interval between pregnancies affecting pregnancy in first trimester, antepartum 12/22/2022   Morbid obesity with body mass index (BMI) of 40.0 to 44.9 in adult [facility]   History of gestational diabetes 07/25/2022   Anxiety and  depression 07/25/2022     Plan:  1. Supervision of high risk pregnancy, antepartum New OB labs - [place] MFM OB DETAIL +14 WK; Future - PANORAMA PRENATAL TEST FULL PANEL - CBC/D/Plt+RPR+Rh+ABO+RubIgG... - Culture, OB Urine - Urine cytology ancillary only  2. Short interval between pregnancies affecting pregnancy in first trimester, antepartum   3. Morbid obesity with body mass index (BMI) of 40.0 to 44.9 in adult ([place]) - [place] MFM OB DETAIL +14 WK; Future  4. Anxiety and depression On zoloft  5. History of gestational diabetes Check A1C and early challenge if indicated - Hemoglobin A1c  6. Elevated blood pressure affecting pregnancy in second trimester, antepartum Baseline labs Begin ASA - Comprehensive metabolic panel - Protein /  creatinine ratio, urine - TSH - aspirin 81 MG chewable tablet; Chew 1 tablet (81 mg total) by mouth daily.  Dispense: 90 tablet; Refill: 0   Initial labs drawn. Continue prenatal vitamins. Genetic Screening discussed, NIPS: ordered. Ultrasound discussed; fetal anatomic survey: ordered. Problem list reviewed and updated. The nature of [facility] with multiple MDs and other Advanced Practice Providers was explained to patient; also emphasized that residents, students are part of our team. Routine obstetric precautions reviewed. Return in 4 weeks (on 02/21/2023).

## 2023-01-25 ENCOUNTER — Encounter: Payer: Self-pay | Admitting: Family Medicine

## 2023-01-26 LAB — COMPREHENSIVE METABOLIC PANEL
ALT: 12 IU/L (ref 0–32)
AST: 8 IU/L (ref 0–40)
Albumin/Globulin Ratio: 1.4 (ref 1.2–2.2)
Albumin: 3.8 g/dL — ABNORMAL LOW (ref 3.9–4.9)
Alkaline Phosphatase: 72 IU/L (ref 44–121)
BUN/Creatinine Ratio: 12 (ref 9–23)
BUN: 8 mg/dL (ref 6–20)
Bilirubin Total: 0.2 mg/dL (ref 0.0–1.2)
CO2: 20 mmol/L (ref 20–29)
Calcium: 8.8 mg/dL (ref 8.7–10.2)
Chloride: 107 mmol/L — ABNORMAL HIGH (ref 96–106)
Creatinine, Ser: 0.66 mg/dL (ref 0.57–1.00)
Globulin, Total: 2.8 g/dL (ref 1.5–4.5)
Glucose: 91 mg/dL (ref 70–99)
Potassium: 4.3 mmol/L (ref 3.5–5.2)
Sodium: 141 mmol/L (ref 134–144)
Total Protein: 6.6 g/dL (ref 6.0–8.5)
eGFR: 120 mL/min/{1.73_m2} (ref >59)

## 2023-01-26 LAB — CBC/D/PLT+RPR+RH+ABO+RUBIGG...
Antibody Screen: NEGATIVE
Basophils Absolute: 0.1 10*3/uL (ref 0.0–0.2)
Basos: 1 %
EOS (ABSOLUTE): 0.1 10*3/uL (ref 0.0–0.4)
Eos: 1 %
HCV Ab: NONREACTIVE
HIV Screen 4th Generation wRfx: NONREACTIVE
Hematocrit: 39.7 % (ref 34.0–46.6)
Hemoglobin: 13 g/dL (ref 11.1–15.9)
Hepatitis B Surface Ag: NEGATIVE
Immature Grans (Abs): 0 10*3/uL (ref 0.0–0.1)
Immature Granulocytes: 0 %
Lymphocytes Absolute: 1.9 10*3/uL (ref 0.7–3.1)
Lymphs: 18 %
MCH: 30.2 pg (ref 26.6–33.0)
MCHC: 32.7 g/dL (ref 31.5–35.7)
MCV: 92 fL (ref 79–97)
Monocytes Absolute: 0.8 10*3/uL (ref 0.1–0.9)
Monocytes: 7 %
Neutrophils Absolute: 7.7 10*3/uL — ABNORMAL HIGH (ref 1.4–7.0)
Neutrophils: 73 %
Platelets: 386 10*3/uL (ref 150–450)
RBC: 4.31 x10E6/uL (ref 3.77–5.28)
RDW: 12 % (ref 11.7–15.4)
RPR Ser Ql: NONREACTIVE
Rh Factor: POSITIVE
Rubella Antibodies, IGG: 1.77 index (ref >0.99)
WBC: 10.6 10*3/uL (ref 3.4–10.8)

## 2023-01-26 LAB — HCV INTERPRETATION

## 2023-01-26 LAB — PROTEIN / CREATININE RATIO, URINE
Creatinine, Urine: 197.7 mg/dL
Protein, Ur: 16.1 mg/dL
Protein/Creat Ratio: 81 mg/g{creat} (ref 0–200)

## 2023-01-26 LAB — HEMOGLOBIN A1C
Est. average glucose Bld gHb Est-mCnc: 111 mg/dL
Hgb A1c MFr Bld: 5.5 % (ref 4.8–5.6)

## 2023-01-26 LAB — URINE CYTOLOGY ANCILLARY ONLY
Chlamydia: NEGATIVE
Comment: NEGATIVE
Comment: NORMAL
Neisseria Gonorrhea: NEGATIVE

## 2023-01-26 LAB — URINE CULTURE, OB REFLEX

## 2023-01-26 LAB — TSH: TSH: 1.31 u[IU]/mL (ref 0.450–4.500)

## 2023-01-26 LAB — CULTURE, OB URINE

## 2023-01-31 LAB — PANORAMA PRENATAL TEST FULL PANEL:PANORAMA TEST PLUS 5 ADDITIONAL MICRODELETIONS: FETAL FRACTION: 9

## 2023-02-21 ENCOUNTER — Encounter: Payer: BC Managed Care – PPO | Admitting: Family Medicine

## 2023-02-21 ENCOUNTER — Encounter: Payer: Self-pay | Admitting: Family Medicine

## 2023-02-21 ENCOUNTER — Ambulatory Visit (INDEPENDENT_AMBULATORY_CARE_PROVIDER_SITE_OTHER): Payer: BC Managed Care – PPO | Admitting: Family Medicine

## 2023-02-21 VITALS — BP 130/84 | HR 90 | Wt 226.0 lb

## 2023-02-21 DIAGNOSIS — O0990 Supervision of high risk pregnancy, unspecified, unspecified trimester: Secondary | ICD-10-CM

## 2023-02-21 DIAGNOSIS — O09891 Supervision of other high risk pregnancies, first trimester: Secondary | ICD-10-CM

## 2023-02-21 DIAGNOSIS — Z8632 Personal history of gestational diabetes: Secondary | ICD-10-CM

## 2023-02-21 NOTE — Progress Notes (Signed)
    PRENATAL VISIT NOTE  Subjective:  Tina Dixon is a 32 y.o. G2P1001 at [redacted]w[redacted]d being seen today for ongoing prenatal care.  She is currently monitored for the following issues for this low-risk pregnancy and has History of gestational diabetes; Anxiety and depression; Morbid obesity with body mass index (BMI) of 40.0 to 44.9 in adult; Supervision of high risk pregnancy, antepartum; and Short interval between pregnancies affecting pregnancy in first trimester, antepartum on their problem list.  Patient reports no complaints.  Contractions: Not present. Vag. Bleeding: None.  Movement: Present. Denies leaking of fluid.   The following portions of the patient's history were reviewed and updated as appropriate: allergies, current medications, past family history, past medical history, past social history, past surgical history and problem list.   Objective:   Vitals:   02/21/23 1122  BP: 130/84  Pulse: 90  Weight: 226 lb (102.5 kg)    Fetal Status: Fetal Heart Rate (bpm): 141   Movement: Present     General:  Alert, oriented and cooperative. Patient is in no acute distress.  Skin: Skin is warm and dry. No rash noted.   Cardiovascular: Normal heart rate noted  Respiratory: Normal respiratory effort, no problems with respiration noted  Abdomen: Soft, gravid, appropriate for gestational age.  Pain/Pressure: Present     Pelvic: Cervical exam deferred        Extremities: Normal range of motion.  Edema: None  Mental Status: Normal mood and affect. Normal behavior. Normal judgment and thought content.   Assessment and Plan:  Pregnancy: G2P1001 at [redacted]w[redacted]d 1. Supervision of high risk pregnancy, antepartum Continue routine prenatal care. Scheduled for anatomy u/s AFP today - AFP, Serum, Open Spina Bifida  2. History of gestational diabetes Normal early screen  3. Short interval between pregnancies affecting pregnancy in first trimester, antepartum   General obstetric precautions  including but not limited to vaginal bleeding, contractions, leaking of fluid and fetal movement were reviewed in detail with the patient. Please refer to After Visit Summary for other counseling recommendations.   Return in 4 weeks (on 03/21/2023).  Future Appointments  Date Time Provider Department Center  03/19/2023  9:30 AM [facility] NURSE [facility]  03/19/2023  9:45 AM [facility] US4 [facility]  03/21/2023 11:15 AM Reva Bores, MD [facility]    Reva Bores, MD
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
## 2023-02-23 LAB — AFP, SERUM, OPEN SPINA BIFIDA
AFP MoM: 1.13
AFP Value: 30.9 ng/mL
Gest. Age on Collection Date: 16.3 weeks
Maternal Age At EDD: 31.5 yr
OSBR Risk 1 IN: 8106
Test Results:: NEGATIVE
Weight: 226 [lb_av]

## 2023-03-16 ENCOUNTER — Encounter: Payer: Self-pay | Admitting: *Deleted

## 2023-03-19 ENCOUNTER — Ambulatory Visit: Payer: BC Managed Care – PPO | Admitting: *Deleted

## 2023-03-19 ENCOUNTER — Ambulatory Visit: Payer: BC Managed Care – PPO | Attending: Family Medicine

## 2023-03-19 ENCOUNTER — Other Ambulatory Visit: Payer: Self-pay | Admitting: *Deleted

## 2023-03-19 VITALS — BP 121/80 | HR 90

## 2023-03-19 DIAGNOSIS — O0990 Supervision of high risk pregnancy, unspecified, unspecified trimester: Secondary | ICD-10-CM | POA: Diagnosis not present

## 2023-03-19 DIAGNOSIS — E669 Obesity, unspecified: Secondary | ICD-10-CM

## 2023-03-19 DIAGNOSIS — O09891 Supervision of other high risk pregnancies, first trimester: Secondary | ICD-10-CM | POA: Diagnosis not present

## 2023-03-19 DIAGNOSIS — O99212 Obesity complicating pregnancy, second trimester: Secondary | ICD-10-CM

## 2023-03-19 DIAGNOSIS — Z6841 Body Mass Index (BMI) 40.0 and over, adult: Secondary | ICD-10-CM | POA: Insufficient documentation

## 2023-03-19 DIAGNOSIS — Z3A20 20 weeks gestation of pregnancy: Secondary | ICD-10-CM

## 2023-03-19 DIAGNOSIS — Z8632 Personal history of gestational diabetes: Secondary | ICD-10-CM

## 2023-03-19 DIAGNOSIS — O09292 Supervision of pregnancy with other poor reproductive or obstetric history, second trimester: Secondary | ICD-10-CM

## 2023-03-21 ENCOUNTER — Encounter: Payer: BC Managed Care – PPO | Admitting: Family Medicine

## 2023-03-21 ENCOUNTER — Ambulatory Visit (INDEPENDENT_AMBULATORY_CARE_PROVIDER_SITE_OTHER): Payer: BC Managed Care – PPO | Admitting: Family Medicine

## 2023-03-21 VITALS — BP 128/83 | HR 93 | Wt 236.0 lb

## 2023-03-21 DIAGNOSIS — F419 Anxiety disorder, unspecified: Secondary | ICD-10-CM

## 2023-03-21 DIAGNOSIS — O09891 Supervision of other high risk pregnancies, first trimester: Secondary | ICD-10-CM

## 2023-03-21 DIAGNOSIS — O0990 Supervision of high risk pregnancy, unspecified, unspecified trimester: Secondary | ICD-10-CM

## 2023-03-21 DIAGNOSIS — F32A Depression, unspecified: Secondary | ICD-10-CM

## 2023-03-21 DIAGNOSIS — Z8632 Personal history of gestational diabetes: Secondary | ICD-10-CM

## 2023-03-21 MED ORDER — HYDROXYZINE HCL 10 MG PO TABS: 10.0000 mg | ORAL_TABLET | Freq: Three times a day (TID) | ORAL | 0 refills | Status: DC | PRN

## 2023-03-21 MED ORDER — SERTRALINE HCL 100 MG PO TABS: 100.0000 mg | ORAL_TABLET | Freq: Every day | ORAL | 1 refills | Status: DC

## 2023-03-21 NOTE — Progress Notes (Signed)
    PRENATAL VISIT NOTE  Subjective:  Tina Dixon is a 32 y.o. G2P1001 at [redacted]w[redacted]d being seen today for ongoing prenatal care.  She is currently monitored for the following issues for this low-risk pregnancy and has History of gestational diabetes; Morbid obesity with body mass index (BMI) of 40.0 to 44.9 in adult [facility]); Supervision of high risk pregnancy, antepartum; and Short interval between pregnancies affecting pregnancy in first trimester, antepartum on their problem list.  Patient reports  anxiety and panic attacks associated with recent passing of her aunt .  Contractions: Not present. Vag. Bleeding: None.  Movement: Present. Denies leaking of fluid.   The following portions of the patient's history were reviewed and updated as appropriate: allergies, current medications, past family history, past medical history, past social history, past surgical history and problem list.   Objective:   Vitals:   03/21/23 1120  BP: 128/83  Pulse: 93  Weight: 236 lb (107 kg)    Fetal Status: Fetal Heart Rate (bpm): 140   Movement: Present     General:  Alert, oriented and cooperative. Patient is in no acute distress.  Skin: Skin is warm and dry. No rash noted.   Cardiovascular: Normal heart rate noted  Respiratory: Normal respiratory effort, no problems with respiration noted  Abdomen: Soft, gravid, appropriate for gestational age.  Pain/Pressure: Absent     Pelvic: Cervical exam deferred        Extremities: Normal range of motion.  Edema: None  Mental Status: Normal mood and affect. Normal behavior. Normal judgment and thought content.   Assessment and Plan:  Pregnancy: G2P1001 at [redacted]w[redacted]d 1. Supervision of high risk pregnancy, antepartum Continue routine prenatal care.  2. History of gestational diabetes Normal early testing, Repeat @ 26-28 weeks  3. Short interval between pregnancies affecting pregnancy in first trimester, antepartum   4. Anxiety and depression Increase her zoloft  and add Vistaril for panic attackes. - sertraline (ZOLOFT) 100 MG tablet; Take 1 tablet (100 mg total) by mouth daily.  Dispense: 90 tablet; Refill: 1 - hydrOXYzine (ATARAX) 10 MG tablet; Take 1 tablet (10 mg total) by mouth 3 (three) times daily as needed.  Dispense: 30 tablet; Refill: 0  Preterm labor symptoms and general obstetric precautions including but not limited to vaginal bleeding, contractions, leaking of fluid and fetal movement were reviewed in detail with the patient. Please refer to After Visit Summary for other counseling recommendations.   Return in 4 weeks (on 04/18/2023).  Future Appointments  Date Time Provider Department Center  04/18/2023 11:15 AM Reva Bores, MD [facility]  04/30/2023  3:30 PM [facility] US3 [facility]    Reva Bores, MD

## 2023-03-21 NOTE — Progress Notes (Signed)
ROB   CC: Anxiety Aunt recently passed on 04-24-2023.

## 2023-04-18 ENCOUNTER — Ambulatory Visit (INDEPENDENT_AMBULATORY_CARE_PROVIDER_SITE_OTHER): Payer: BC Managed Care – PPO | Admitting: Family Medicine

## 2023-04-18 VITALS — BP 123/85 | HR 93 | Wt 238.0 lb

## 2023-04-18 DIAGNOSIS — Z6841 Body Mass Index (BMI) 40.0 and over, adult: Secondary | ICD-10-CM

## 2023-04-18 DIAGNOSIS — Z8632 Personal history of gestational diabetes: Secondary | ICD-10-CM

## 2023-04-18 DIAGNOSIS — O0990 Supervision of high risk pregnancy, unspecified, unspecified trimester: Secondary | ICD-10-CM

## 2023-04-18 NOTE — Progress Notes (Signed)
   PRENATAL VISIT NOTE  Subjective:  Tina Dixon is a 32 y.o. G2P1001 at 102w3d being seen today for ongoing prenatal care.  She is currently monitored for the following issues for this low-risk pregnancy and has History of gestational diabetes; Morbid obesity with body mass index (BMI) of 40.0 to 44.9 in adult [facility]); Supervision of high risk pregnancy, antepartum; and Short interval between pregnancies affecting pregnancy in first trimester, antepartum on their problem list.  Patient reports no complaints.  Contractions: Not present. Vag. Bleeding: None.  Movement: Present. Denies leaking of fluid.   The following portions of the patient's history were reviewed and updated as appropriate: allergies, current medications, past family history, past medical history, past social history, past surgical history and problem list.   Objective:   Vitals:   04/18/23 1137  BP: 123/85  Pulse: 93  Weight: 238 lb (108 kg)    Fetal Status: Fetal Heart Rate (bpm): 135 Fundal Height: 26 cm Movement: Present     General:  Alert, oriented and cooperative. Patient is in no acute distress.  Skin: Skin is warm and dry. No rash noted.   Cardiovascular: Normal heart rate noted  Respiratory: Normal respiratory effort, no problems with respiration noted  Abdomen: Soft, gravid, appropriate for gestational age.  Pain/Pressure: Absent     Pelvic: Cervical exam deferred        Extremities: Normal range of motion.  Edema: None  Mental Status: Normal mood and affect. Normal behavior. Normal judgment and thought content.   Assessment and Plan:  Pregnancy: G2P1001 at [redacted]w[redacted]d 1. Supervision of high risk pregnancy, antepartum Continue routine prenatal care.  2. Morbid obesity with body mass index (BMI) of 40.0 to 44.9 in adult ([facility]) U/s for growth in 2 weeks  3. History of gestational diabetes 28 week labs next visit  Preterm labor symptoms and general obstetric precautions including but not limited to vaginal  bleeding, contractions, leaking of fluid and fetal movement were reviewed in detail with the patient. Please refer to After Visit Summary for other counseling recommendations.   Return in 4 weeks (on 05/16/2023) for 28 wk labs.  Future Appointments  Date Time Provider Department Center  04/30/2023  3:30 PM [facility] US3 [facility]  05/16/2023  8:30 AM [facility] LAB [facility]  05/16/2023  9:35 AM Gackle Bing, MD [facility]  05/30/2023  3:30 PM Anyanwu, Jethro Bastos, MD [facility]  06/13/2023  3:30 PM Reva Bores, MD [facility]    Reva Bores, MD

## 2023-04-18 NOTE — Progress Notes (Signed)
CC: Heartburn - tums are no benefit

## 2023-04-30 ENCOUNTER — Ambulatory Visit: Payer: BC Managed Care – PPO | Attending: Maternal & Fetal Medicine

## 2023-04-30 DIAGNOSIS — Z3A26 26 weeks gestation of pregnancy: Secondary | ICD-10-CM | POA: Diagnosis not present

## 2023-04-30 DIAGNOSIS — O358XX0 Maternal care for other (suspected) fetal abnormality and damage, not applicable or unspecified: Secondary | ICD-10-CM

## 2023-04-30 DIAGNOSIS — O99212 Obesity complicating pregnancy, second trimester: Secondary | ICD-10-CM | POA: Insufficient documentation

## 2023-05-01 ENCOUNTER — Other Ambulatory Visit: Payer: Self-pay | Admitting: *Deleted

## 2023-05-01 DIAGNOSIS — O09299 Supervision of pregnancy with other poor reproductive or obstetric history, unspecified trimester: Secondary | ICD-10-CM

## 2023-05-01 DIAGNOSIS — Z3689 Encounter for other specified antenatal screening: Secondary | ICD-10-CM

## 2023-05-01 DIAGNOSIS — Z362 Encounter for other antenatal screening follow-up: Secondary | ICD-10-CM

## 2023-05-16 ENCOUNTER — Ambulatory Visit (INDEPENDENT_AMBULATORY_CARE_PROVIDER_SITE_OTHER): Payer: BC Managed Care – PPO | Admitting: Obstetrics and Gynecology

## 2023-05-16 ENCOUNTER — Other Ambulatory Visit: Payer: BC Managed Care – PPO

## 2023-05-16 VITALS — BP 123/87 | HR 88 | Wt 242.6 lb

## 2023-05-16 DIAGNOSIS — O0990 Supervision of high risk pregnancy, unspecified, unspecified trimester: Secondary | ICD-10-CM

## 2023-05-16 DIAGNOSIS — Z3A28 28 weeks gestation of pregnancy: Secondary | ICD-10-CM

## 2023-05-16 DIAGNOSIS — O10919 Unspecified pre-existing hypertension complicating pregnancy, unspecified trimester: Secondary | ICD-10-CM

## 2023-05-16 DIAGNOSIS — O99343 Other mental disorders complicating pregnancy, third trimester: Secondary | ICD-10-CM

## 2023-05-16 DIAGNOSIS — Z6841 Body Mass Index (BMI) 40.0 and over, adult: Secondary | ICD-10-CM

## 2023-05-16 DIAGNOSIS — F419 Anxiety disorder, unspecified: Secondary | ICD-10-CM

## 2023-05-16 DIAGNOSIS — Z23 Encounter for immunization: Secondary | ICD-10-CM

## 2023-05-16 DIAGNOSIS — O09891 Supervision of other high risk pregnancies, first trimester: Secondary | ICD-10-CM

## 2023-05-16 NOTE — Progress Notes (Signed)
CC: Denies any concerns  

## 2023-05-16 NOTE — Progress Notes (Signed)
    PRENATAL VISIT NOTE  Subjective:  Tina Dixon is a 32 y.o. G2P1001 at [redacted]w[redacted]d being seen today for ongoing prenatal care.  She is currently monitored for the following issues for this low-risk pregnancy and has Chronic hypertension during pregnancy; History of gestational diabetes; Anxiety and depression; Morbid obesity with body mass index (BMI) of 40.0 to 44.9 in adult [facility]); Supervision of high risk pregnancy, antepartum; and Short interval between pregnancies affecting pregnancy in first trimester, antepartum on their problem list.  Patient reports no complaints.  Contractions: Not present. Vag. Bleeding: None.  Movement: Present. Denies leaking of fluid.   The following portions of the patient's history were reviewed and updated as appropriate: allergies, current medications, past family history, past medical history, past social history, past surgical history and problem list.   Objective:   Vitals:   05/16/23 0912  BP: 123/87  Pulse: 88  Weight: 242 lb 9.6 oz (110 kg)    Fetal Status: Fetal Heart Rate (bpm): 147 Fundal Height: 28 cm Movement: Present     General:  Alert, oriented and cooperative. Patient is in no acute distress.  Skin: Skin is warm and dry. No rash noted.   Cardiovascular: Normal heart rate noted  Respiratory: Normal respiratory effort, no problems with respiration noted  Abdomen: Soft, gravid, appropriate for gestational age.  Pain/Pressure: Present     Pelvic: Cervical exam deferred        Extremities: Normal range of motion.  Edema: None  Mental Status: Normal mood and affect. Normal behavior. Normal judgment and thought content.   Assessment and Plan:  Pregnancy: G2P1001 at [redacted]w[redacted]d 1. Supervision of high risk pregnancy, antepartum Birth control options d/w her.  2. [redacted] weeks gestation of pregnancy 28wk labs today  3. Morbid obesity with body mass index (BMI) of 40.0 to 44.9 in adult ([facility]) Weight good. Continue surveillance scans 6/17: 70%, 993gm,  ac 71%, afi 17  4. Short interval between pregnancies affecting pregnancy in first trimester, antepartum  5. Anxiety and depression Doing well on zoloft  6. Chronic hypertension during pregnancy Doing well on no meds Continue low dose asa  Preterm labor symptoms and general obstetric precautions including but not limited to vaginal bleeding, contractions, leaking of fluid and fetal movement were reviewed in detail with the patient. Please refer to After Visit Summary for other counseling recommendations.   Return in about 2 weeks (around 05/30/2023) for low risk ob, md or app, in person.  Future Appointments  Date Time Provider Department Center  05/30/2023 12:45 PM [facility] US5 [facility]  05/30/2023  3:30 PM Anyanwu, Jethro Bastos, MD [facility]  06/13/2023  3:30 PM Reva Bores, MD [facility]  06/25/2023  3:45 PM [facility] US5 [facility]    Fontana Bing, MD

## 2023-05-17 LAB — CBC
Hematocrit: 35.5 % (ref 34.0–46.6)
Hemoglobin: 11.6 g/dL (ref 11.1–15.9)
MCH: 29.6 pg (ref 26.6–33.0)
MCHC: 32.7 g/dL (ref 31.5–35.7)
MCV: 91 fL (ref 79–97)
Platelets: 325 10*3/uL (ref 150–450)
RBC: 3.92 x10E6/uL (ref 3.77–5.28)
RDW: 12.6 % (ref 11.7–15.4)
WBC: 9.6 10*3/uL (ref 3.4–10.8)

## 2023-05-17 LAB — HIV ANTIBODY (ROUTINE TESTING W REFLEX): HIV Screen 4th Generation wRfx: NONREACTIVE

## 2023-05-17 LAB — GLUCOSE TOLERANCE, 2 HOURS W/ 1HR
Glucose, 1 hour: 166 mg/dL (ref 70–179)
Glucose, 2 hour: 116 mg/dL (ref 70–152)
Glucose, Fasting: 100 mg/dL — ABNORMAL HIGH (ref 70–91)

## 2023-05-17 LAB — RPR: RPR Ser Ql: NONREACTIVE

## 2023-05-18 ENCOUNTER — Encounter: Payer: Self-pay | Admitting: Obstetrics and Gynecology

## 2023-05-21 ENCOUNTER — Other Ambulatory Visit: Payer: Self-pay | Admitting: *Deleted

## 2023-05-21 MED ORDER — ACCU-CHEK GUIDE VI STRP: ORAL_STRIP | 12 refills | Status: DC

## 2023-05-21 MED ORDER — ACCU-CHEK SOFTCLIX LANCETS MISC: 1.0000 | Freq: Four times a day (QID) | 12 refills | Status: DC

## 2023-05-21 MED ORDER — ACCU-CHEK GUIDE W/DEVICE KIT: 1.0000 | PACK | Freq: Four times a day (QID) | 0 refills | Status: DC

## 2023-05-24 ENCOUNTER — Encounter: Payer: Self-pay | Admitting: *Deleted

## 2023-05-30 ENCOUNTER — Ambulatory Visit: Payer: BC Managed Care – PPO | Admitting: Obstetrics & Gynecology

## 2023-05-30 ENCOUNTER — Encounter: Payer: Self-pay | Admitting: Obstetrics & Gynecology

## 2023-05-30 ENCOUNTER — Other Ambulatory Visit: Payer: Self-pay | Admitting: *Deleted

## 2023-05-30 ENCOUNTER — Ambulatory Visit: Payer: BC Managed Care – PPO | Attending: Obstetrics and Gynecology

## 2023-05-30 VITALS — BP 126/86 | HR 93 | Wt 244.0 lb

## 2023-05-30 DIAGNOSIS — Z3689 Encounter for other specified antenatal screening: Secondary | ICD-10-CM

## 2023-05-30 DIAGNOSIS — O0990 Supervision of high risk pregnancy, unspecified, unspecified trimester: Secondary | ICD-10-CM

## 2023-05-30 DIAGNOSIS — O10919 Unspecified pre-existing hypertension complicating pregnancy, unspecified trimester: Secondary | ICD-10-CM

## 2023-05-30 DIAGNOSIS — O99213 Obesity complicating pregnancy, third trimester: Secondary | ICD-10-CM

## 2023-05-30 DIAGNOSIS — Z6841 Body Mass Index (BMI) 40.0 and over, adult: Secondary | ICD-10-CM

## 2023-05-30 DIAGNOSIS — Z3A30 30 weeks gestation of pregnancy: Secondary | ICD-10-CM

## 2023-05-30 DIAGNOSIS — Z8632 Personal history of gestational diabetes: Secondary | ICD-10-CM | POA: Diagnosis not present

## 2023-05-30 DIAGNOSIS — O24414 Gestational diabetes mellitus in pregnancy, insulin controlled: Secondary | ICD-10-CM

## 2023-05-30 DIAGNOSIS — O09293 Supervision of pregnancy with other poor reproductive or obstetric history, third trimester: Secondary | ICD-10-CM

## 2023-05-30 DIAGNOSIS — O09299 Supervision of pregnancy with other poor reproductive or obstetric history, unspecified trimester: Secondary | ICD-10-CM | POA: Insufficient documentation

## 2023-05-30 DIAGNOSIS — E669 Obesity, unspecified: Secondary | ICD-10-CM

## 2023-05-30 DIAGNOSIS — O24419 Gestational diabetes mellitus in pregnancy, unspecified control: Secondary | ICD-10-CM

## 2023-05-30 DIAGNOSIS — Z362 Encounter for other antenatal screening follow-up: Secondary | ICD-10-CM | POA: Insufficient documentation

## 2023-05-30 DIAGNOSIS — O09899 Supervision of other high risk pregnancies, unspecified trimester: Secondary | ICD-10-CM

## 2023-05-30 MED ORDER — INSULIN NPH (HUMAN) (ISOPHANE) 100 UNIT/ML ~~LOC~~ SUSP
6.0000 [IU] | Freq: Every day | SUBCUTANEOUS | 11 refills | Status: DC
Start: 2023-05-30 — End: 2023-07-11

## 2023-05-30 MED ORDER — INSULIN SYRINGES (DISPOSABLE) U-100 0.5 ML MISC
3 refills | Status: DC
Start: 2023-05-30 — End: 2023-07-18

## 2023-05-30 NOTE — Patient Instructions (Signed)

## 2023-05-30 NOTE — Progress Notes (Signed)
PRENATAL VISIT NOTE  Subjective:  Tina Dixon is a 32 y.o. G2P1001 at [redacted]w[redacted]d being seen today for ongoing prenatal care.  She is currently monitored for the following issues for this high-risk pregnancy and has Chronic hypertension during pregnancy; Insulin controlled gestational diabetes mellitus (GDM) in third trimester; Anxiety and depression; Morbid obesity with body mass index (BMI) of 40.0 to 44.9 in adult [facility]); Supervision of high risk pregnancy, antepartum; and Short interval between pregnancies affecting pregnancy in first trimester, antepartum on their problem list.  Patient reports no complaints.  Contractions: Not present. Vag. Bleeding: None.  Movement: Present. Denies leaking of fluid.   The following portions of the patient's history were reviewed and updated as appropriate: allergies, current medications, past family history, past medical history, past social history, past surgical history and problem list.   Objective:   Vitals:   05/30/23 1531  BP: 126/86  Pulse: 93  Weight: 244 lb (110.7 kg)    Fetal Status: Fetal Heart Rate (bpm): 141   Movement: Present     General:  Alert, oriented and cooperative. Patient is in no acute distress.  Skin: Skin is warm and dry. No rash noted.   Cardiovascular: Normal heart rate noted  Respiratory: Normal respiratory effort, no problems with respiration noted  Abdomen: Soft, gravid, appropriate for gestational age.  Pain/Pressure: Absent     Pelvic: Cervical exam deferred        Extremities: Normal range of motion.  Edema: None  Mental Status: Normal mood and affect. Normal behavior. Normal judgment and thought content.   [place] MFM OB FOLLOW UP  Result Date: 05/30/2023 ----------------------------------------------------------------------  OBSTETRICS REPORT                       (Signed Final 05/30/2023 02:19 pm) ---------------------------------------------------------------------- Patient Info  ID #:       [id number]                           D.O.B.:  07/19/1991 (32 yrs)  Name:       Tina Dixon                    Visit Date: 05/30/2023 01:35 pm ---------------------------------------------------------------------- Performed By  Attending:        Braxton Feathers DO       Ref. Address:     [address]                                                             [address]  Performed By:     Kris Hartmann,      [facility]  Referred By:      [facility] ---------------------------------------------------------------------- Orders  #  Description                           Code        Ordered By  1  [place] MFM OB FOLLOW UP                   82956.21    Noralee Space ----------------------------------------------------------------------  #  Order #                     Accession #                Episode #  1  [id number]                   [id number]                 [id number] ---------------------------------------------------------------------- Indications  [redacted] weeks gestation of pregnancy                Z3A.30  Poor obstetric history: Previous gestational   O09.299  diabetes  Short interval between pregnancies             O09.899  complicating pregnancy, antepartum  Gestational diabetes in pregnancy, insulin     O24.414  controlled  Obesity complicating pregnancy, third          O99.213  trimester ( pre-G BMI 38 )  LR NIPS, AFP negative ---------------------------------------------------------------------- Fetal Evaluation  Num Of Fetuses:         1  Fetal Heart Rate(bpm):  147  Cardiac Activity:       Observed  Presentation:           Cephalic  Placenta:               Anterior  P. Cord Insertion:      Previously visualized  Amniotic Fluid  AFI FV:      Within normal limits  ---------------------------------------------------------------------- Biometry  BPD:      78.2  mm     G. Age:  31w 3d         68  %    CI:        75.31   %    70 - 86                                                          FL/HC:      21.0   %    19.2 - 21.4  HC:      285.8  mm     G. Age:  31w 3d         41  %    HC/AC:      1.11        0.99 - 1.21  AC:  258.5  mm     G. Age:  30w 0d         33  %    FL/BPD:     76.9   %    71 - 87  FL:       60.1  mm     G. Age:  31w 2d         60  %    FL/AC:      23.2   %    20 - 24  HUM:      54.1  mm     G. Age:  31w 3d         72  %  LV:        5.1  mm  Est. FW:    1614  gm      3 lb 9 oz     45  % ---------------------------------------------------------------------- OB History  Blood Type:   A+  Gravidity:    2         Term:   1        Prem:   0        SAB:   0  TOP:          0       Ectopic:  0        Living: 1 ---------------------------------------------------------------------- Gestational Age  LMP:           30w 3d        Date:  10/29/22                 EDD:   08/05/23  U/S Today:     31w 0d                                        EDD:   08/01/23  Best:          30w 3d     Det. By:  LMP  (10/29/22)          EDD:   08/05/23 ---------------------------------------------------------------------- Anatomy  Cranium:               Appears normal         Aortic Arch:            Previously seen  Cavum:                 Previously seen        Ductal Arch:            Previously seen  Ventricles:            Previously seen        Diaphragm:              Appears normal  Choroid Plexus:        Previously seen        Stomach:                Appears normal, left  sided  Cerebellum:            Previously seen        Abdomen:                Appears normal  Posterior Fossa:       Previously seen        Abdominal Wall:         Previously seen  Nuchal Fold:           Previously seen        Cord Vessels:            Previously seen  Face:                  Appears normal         Kidneys:                Appear normal                         (profile)  Lips:                  Previously seen        Bladder:                Appears normal  Thoracic:              Appears normal         Spine:                  Previously seen  Heart:                 Appears normal         Upper Extremities:      Previously seen                         (4CH, axis, and                         situs)  RVOT:                  Appears normal         Lower Extremities:      Previously seen  LVOT:                  Appears normal  Other:  SVC/IVC, 3VV, 3VTV previously visualized. Hands and feet          previously visualized. Heels previously seen. Female gender.          Technically difficult due to maternal habitus. ---------------------------------------------------------------------- Cervix Uterus Adnexa  Right Ovary  Within normal limits.  Left Ovary  Within normal limits. ---------------------------------------------------------------------- Comments  The patient is here for ultrasound at 30w 3d. EDD:  08/05/2023 dated by LMP  (10/29/22).  Her pregnancy is  complicated by gDMA1.  Her fasting blood sugars are in the  80s to 115 range with the majority over 95.  Her postprandial  blood sugars are typically in the 80s to 90s.  I discussed that  NPH would be a good choice if she cannot lower her fasting  blood sugar with a high-protein snack at night.  She would  also need diabetic education to learn how to give herself  insulin.  Sonographic findings  Single intrauterine pregnancy.  Fetal cardiac activity:  Observed and appears normal.  Presentation: Cephalic.  Interval fetal anatomy appears normal.  Fetal biometry shows the estimated fetal weight at the 45  percentile.  Amniotic fluid volume: Within normal limits. MVP:  cm.  Placenta: Anterior.  Recommendations  -Due to persistent fasting hyperglycemia I recommend  starting 6 units of NPH at night.  I do not  recommend  metformin since this is less effective at targeting fasting blood  sugars and will lower her postprandial blood sugars which  have all been at goal.  -Please refer the patient to diabetic education and prescribe  6 units of NPH to be given at night soon as possible  -Antenatal testing to start at 32 weeks  -Serial growth ultrasounds every 4 to 6 weeks  -Delivery around 39 to 39.6 weeks ----------------------------------------------------------------------                  Braxton Feathers, DO Electronically Signed Final Report   05/30/2023 02:19 pm ----------------------------------------------------------------------    Assessment and Plan:  Pregnancy: G2P1001 at [redacted]w[redacted]d 1. Insulin controlled gestational diabetes mellitus (GDM) in third trimester Elevated fasting, started NPH 6 units at bedtime. Will titrate as needed.  Continue to monitor blood sugars. Already scheduled for antenatal screening and scans as per MFM.  - insulin NPH Human (NOVOLIN N) 100 UNIT/ML injection; Inject 0.06 mLs (6 Units total) into the skin at bedtime.  Dispense: 10 mL; Refill: 11 - Insulin Syringes, Disposable, U-100 0.5 ML MISC; Use to inject insulin as directed  Dispense: 30 each; Refill: 3  2. Chronic hypertension during pregnancy Stable BP.   3. Morbid obesity with body mass index (BMI) of 40.0 to 44.9 in adult [facility]) Already scheduled for antenatal screening and scans as per MFM.   4. [redacted] weeks gestation of pregnancy No other concerns. Preterm labor symptoms and general obstetric precautions including but not limited to vaginal bleeding, contractions, leaking of fluid and fetal movement were reviewed in detail with the patient. Please refer to After Visit Summary for other counseling recommendations.   Return for OB visits and antenatal testing as scheduled.  Future Appointments  Date Time Provider Department Center  06/13/2023  1:00 PM [facility] NURSE [facility]  06/13/2023  1:15 PM [facility]   06/13/2023  3:30 PM Reva Bores, MD [facility]  06/20/2023  2:00 PM [facility] NURSE [facility]  06/20/2023  2:15 PM [facility]ST [facility]  06/25/2023  3:45 PM [facility] US5 [facility]    Jaynie Collins, MD

## 2023-06-13 ENCOUNTER — Ambulatory Visit (INDEPENDENT_AMBULATORY_CARE_PROVIDER_SITE_OTHER): Payer: BC Managed Care – PPO | Admitting: Family Medicine

## 2023-06-13 ENCOUNTER — Ambulatory Visit: Payer: BC Managed Care – PPO | Admitting: *Deleted

## 2023-06-13 ENCOUNTER — Ambulatory Visit: Payer: BC Managed Care – PPO | Attending: Maternal & Fetal Medicine | Admitting: *Deleted

## 2023-06-13 VITALS — BP 122/83 | HR 98 | Wt 246.6 lb

## 2023-06-13 VITALS — BP 116/69 | HR 94

## 2023-06-13 DIAGNOSIS — O10013 Pre-existing essential hypertension complicating pregnancy, third trimester: Secondary | ICD-10-CM | POA: Diagnosis not present

## 2023-06-13 DIAGNOSIS — E669 Obesity, unspecified: Secondary | ICD-10-CM | POA: Diagnosis not present

## 2023-06-13 DIAGNOSIS — O99213 Obesity complicating pregnancy, third trimester: Secondary | ICD-10-CM | POA: Diagnosis not present

## 2023-06-13 DIAGNOSIS — O10919 Unspecified pre-existing hypertension complicating pregnancy, unspecified trimester: Secondary | ICD-10-CM

## 2023-06-13 DIAGNOSIS — O24414 Gestational diabetes mellitus in pregnancy, insulin controlled: Secondary | ICD-10-CM

## 2023-06-13 DIAGNOSIS — O10913 Unspecified pre-existing hypertension complicating pregnancy, third trimester: Secondary | ICD-10-CM | POA: Insufficient documentation

## 2023-06-13 DIAGNOSIS — Z6841 Body Mass Index (BMI) 40.0 and over, adult: Secondary | ICD-10-CM

## 2023-06-13 DIAGNOSIS — O09891 Supervision of other high risk pregnancies, first trimester: Secondary | ICD-10-CM

## 2023-06-13 DIAGNOSIS — Z3A32 32 weeks gestation of pregnancy: Secondary | ICD-10-CM

## 2023-06-13 DIAGNOSIS — O0990 Supervision of high risk pregnancy, unspecified, unspecified trimester: Secondary | ICD-10-CM

## 2023-06-13 NOTE — Progress Notes (Signed)
ROB: Denies any concerns  

## 2023-06-13 NOTE — Procedures (Signed)
Tina Dixon Apr 02, 1991 [redacted]w[redacted]d  Fetus A Non-Stress Test Interpretation for 06/13/23--NST only  Indication: Chronic Hypertenstion and obese  Fetal Heart Rate A Mode: External Baseline Rate (A): 135 bpm Variability: Moderate Accelerations: 15 x 15 Decelerations: None Multiple birth?: No  Uterine Activity Mode: Toco Contraction Frequency (min): none Resting Tone Palpated: Relaxed  Interpretation (Fetal Testing) Nonstress Test Interpretation: Reactive Comments: Tracing reviewed by Dr. Darra Lis

## 2023-06-13 NOTE — Progress Notes (Signed)
   PRENATAL VISIT NOTE  Subjective:  Tina Dixon is a 32 y.o. G2P1001 at [redacted]w[redacted]d being seen today for ongoing prenatal care.  She is currently monitored for the following issues for this high-risk pregnancy and has Chronic hypertension during pregnancy; Insulin controlled gestational diabetes mellitus (GDM) in third trimester; Anxiety and depression; Morbid obesity with body mass index (BMI) of 40.0 to 44.9 in adult [facility]); Supervision of high risk pregnancy, antepartum; and Short interval between pregnancies affecting pregnancy in first trimester, antepartum on their problem list.  Patient reports no complaints.  Contractions: Not present. Vag. Bleeding: None.  Movement: Present. Denies leaking of fluid.   The following portions of the patient's history were reviewed and updated as appropriate: allergies, current medications, past family history, past medical history, past social history, past surgical history and problem list.   Objective:   Vitals:   06/13/23 1546  BP: 122/83  Pulse: 98  Weight: 246 lb 9.6 oz (111.9 kg)    Fetal Status: Fetal Heart Rate (bpm): 142 Fundal Height: 32 cm Movement: Present     General:  Alert, oriented and cooperative. Patient is in no acute distress.  Skin: Skin is warm and dry. No rash noted.   Cardiovascular: Normal heart rate noted  Respiratory: Normal respiratory effort, no problems with respiration noted  Abdomen: Soft, gravid, appropriate for gestational age.  Pain/Pressure: Present     Pelvic: Cervical exam deferred        Extremities: Normal range of motion.  Edema: None  Mental Status: Normal mood and affect. Normal behavior. Normal judgment and thought content.   Assessment and Plan:  Pregnancy: G2P1001 at [redacted]w[redacted]d 1. Chronic hypertension during pregnancy BP is good today on n meds On ASA  2. Insulin controlled gestational diabetes mellitus (GDM) in third trimester Reports fastings have been < 100 lately 2 hour pp are in range If noticing  her fastings remain elevated increase pm NPH to 8 units Last growth EFW @ 45%  3. Supervision of high risk pregnancy, antepartum   4. Morbid obesity with body mass index (BMI) of 40.0 to 44.9 in adult [facility])   Preterm labor symptoms and general obstetric precautions including but not limited to vaginal bleeding, contractions, leaking of fluid and fetal movement were reviewed in detail with the patient. Please refer to After Visit Summary for other counseling recommendations.   Return in 2 weeks (on 06/27/2023).  Future Appointments  Date Time Provider Department Center  06/20/2023  2:00 PM [facility] NURSE [facility]  06/20/2023  2:15 PM [facility] NST [facility]  06/25/2023  3:45 PM [facility] US5 [facility]  06/27/2023 10:35 AM Reva Bores, MD [facility]  07/11/2023 10:35 AM Anyanwu, Jethro Bastos, MD [facility]  07/18/2023  9:35 AM Reva Bores, MD [facility]  07/25/2023 10:35 AM Macon Large, Jethro Bastos, MD [facility]  08/01/2023 11:15 AM Reva Bores, MD [facility]    Reva Bores, MD

## 2023-06-20 ENCOUNTER — Ambulatory Visit: Payer: BC Managed Care – PPO

## 2023-06-20 ENCOUNTER — Other Ambulatory Visit (INDEPENDENT_AMBULATORY_CARE_PROVIDER_SITE_OTHER): Payer: BC Managed Care – PPO

## 2023-06-20 ENCOUNTER — Ambulatory Visit (INDEPENDENT_AMBULATORY_CARE_PROVIDER_SITE_OTHER): Payer: BC Managed Care – PPO | Admitting: *Deleted

## 2023-06-20 VITALS — BP 129/83 | HR 103 | Wt 246.0 lb

## 2023-06-20 DIAGNOSIS — O0990 Supervision of high risk pregnancy, unspecified, unspecified trimester: Secondary | ICD-10-CM

## 2023-06-20 DIAGNOSIS — Z3A33 33 weeks gestation of pregnancy: Secondary | ICD-10-CM

## 2023-06-20 DIAGNOSIS — O24414 Gestational diabetes mellitus in pregnancy, insulin controlled: Secondary | ICD-10-CM

## 2023-06-20 NOTE — Progress Notes (Signed)
Patient informed that the ultrasound is considered a limited obstetric ultrasound and is not intended to be a complete ultrasound exam.  Patient also informed that the ultrasound is not being completed with the intent of assessing for fetal or placental anomalies or any pelvic abnormalities. Explained that the purpose of today's ultrasound is to assess for fetal well being.  Patient acknowledges the purpose of the exam and the limitations of the study.        Scheryl Marten, RN   Pt states fasting's are better, ranging from mid 90's-100.

## 2023-06-25 ENCOUNTER — Ambulatory Visit: Payer: BC Managed Care – PPO | Attending: Obstetrics and Gynecology

## 2023-06-25 ENCOUNTER — Other Ambulatory Visit: Payer: Self-pay | Admitting: *Deleted

## 2023-06-25 DIAGNOSIS — O24414 Gestational diabetes mellitus in pregnancy, insulin controlled: Secondary | ICD-10-CM

## 2023-06-25 DIAGNOSIS — Z362 Encounter for other antenatal screening follow-up: Secondary | ICD-10-CM | POA: Insufficient documentation

## 2023-06-25 DIAGNOSIS — E669 Obesity, unspecified: Secondary | ICD-10-CM | POA: Diagnosis not present

## 2023-06-25 DIAGNOSIS — O09299 Supervision of pregnancy with other poor reproductive or obstetric history, unspecified trimester: Secondary | ICD-10-CM | POA: Insufficient documentation

## 2023-06-25 DIAGNOSIS — Z8632 Personal history of gestational diabetes: Secondary | ICD-10-CM | POA: Diagnosis not present

## 2023-06-25 DIAGNOSIS — O10913 Unspecified pre-existing hypertension complicating pregnancy, third trimester: Secondary | ICD-10-CM

## 2023-06-25 DIAGNOSIS — O10013 Pre-existing essential hypertension complicating pregnancy, third trimester: Secondary | ICD-10-CM | POA: Diagnosis not present

## 2023-06-25 DIAGNOSIS — Z3A34 34 weeks gestation of pregnancy: Secondary | ICD-10-CM

## 2023-06-25 DIAGNOSIS — O99213 Obesity complicating pregnancy, third trimester: Secondary | ICD-10-CM

## 2023-06-25 DIAGNOSIS — Z3689 Encounter for other specified antenatal screening: Secondary | ICD-10-CM | POA: Insufficient documentation

## 2023-06-27 ENCOUNTER — Ambulatory Visit (INDEPENDENT_AMBULATORY_CARE_PROVIDER_SITE_OTHER): Payer: BC Managed Care – PPO | Admitting: Family Medicine

## 2023-06-27 VITALS — BP 124/84 | HR 102 | Wt 246.8 lb

## 2023-06-27 DIAGNOSIS — O10919 Unspecified pre-existing hypertension complicating pregnancy, unspecified trimester: Secondary | ICD-10-CM

## 2023-06-27 DIAGNOSIS — O0990 Supervision of high risk pregnancy, unspecified, unspecified trimester: Secondary | ICD-10-CM

## 2023-06-27 DIAGNOSIS — O24414 Gestational diabetes mellitus in pregnancy, insulin controlled: Secondary | ICD-10-CM

## 2023-06-27 NOTE — Progress Notes (Signed)
    PRENATAL VISIT NOTE  Subjective:  Tina Dixon is a 32 y.o. G2P1001 at [redacted]w[redacted]d being seen today for ongoing prenatal care.  She is currently monitored for the following issues for this low-risk pregnancy and has Chronic hypertension during pregnancy; Insulin controlled gestational diabetes mellitus (GDM) in third trimester; Anxiety and depression; Morbid obesity with body mass index (BMI) of 40.0 to 44.9 in adult [facility]); Supervision of high risk pregnancy, antepartum; and Short interval between pregnancies affecting pregnancy in first trimester, antepartum on their problem list.  Patient reports no complaints.  Contractions: Not present. Vag. Bleeding: None.  Movement: Present. Denies leaking of fluid.   The following portions of the patient's history were reviewed and updated as appropriate: allergies, current medications, past family history, past medical history, past social history, past surgical history and problem list.   Objective:   Vitals:   06/27/23 1044  BP: 124/84  Pulse: (!) 102  Weight: 246 lb 12.8 oz (111.9 kg)    Fetal Status: Fetal Heart Rate (bpm): 134   Movement: Present     General:  Alert, oriented and cooperative. Patient is in no acute distress.  Skin: Skin is warm and dry. No rash noted.   Cardiovascular: Normal heart rate noted  Respiratory: Normal respiratory effort, no problems with respiration noted  Abdomen: Soft, gravid, appropriate for gestational age.  Pain/Pressure: Absent     Pelvic: Cervical exam deferred        Extremities: Normal range of motion.  Edema: None  Mental Status: Normal mood and affect. Normal behavior. Normal judgment and thought content.   Assessment and Plan:  Pregnancy: G2P1001 at [redacted]w[redacted]d 1. Chronic hypertension during pregnancy No meds and BP is well controlled  2. Insulin controlled gestational diabetes mellitus (GDM) in third trimester No log--reports fasting CBGs are 95-100 Last u/s for growth with BPP shows EFW 53%  3.  Supervision of high risk pregnancy, antepartum   Preterm labor symptoms and general obstetric precautions including but not limited to vaginal bleeding, contractions, leaking of fluid and fetal movement were reviewed in detail with the patient. Please refer to After Visit Summary for other counseling recommendations.   Return in 2 weeks (on 07/11/2023) for [facility], OB visit and BPP.  Future Appointments  Date Time Provider Department Center  07/04/2023  9:15 AM [facility] NST [facility]  07/11/2023 10:15 AM [facility] NST [facility]  07/11/2023 10:35 AM Anyanwu, Jethro Bastos, MD [facility]  07/18/2023  9:15 AM [facility] NST [facility]  07/18/2023  9:35 AM Reva Bores, MD [facility]  07/23/2023 12:15 PM [facility] NURSE [facility]  07/23/2023 12:30 PM [facility] US3 [facility]  07/25/2023 10:35 AM Anyanwu, Jethro Bastos, MD [facility]  08/01/2023 10:15 AM [facility] NST [facility]  08/01/2023 11:15 AM Reva Bores, MD [facility]    Reva Bores, MD

## 2023-06-27 NOTE — Progress Notes (Signed)
ROB: Denies any concerns  

## 2023-07-04 ENCOUNTER — Other Ambulatory Visit (INDEPENDENT_AMBULATORY_CARE_PROVIDER_SITE_OTHER): Payer: BC Managed Care – PPO

## 2023-07-04 ENCOUNTER — Ambulatory Visit (INDEPENDENT_AMBULATORY_CARE_PROVIDER_SITE_OTHER): Payer: BC Managed Care – PPO | Admitting: *Deleted

## 2023-07-04 VITALS — BP 125/88 | HR 90 | Wt 248.0 lb

## 2023-07-04 DIAGNOSIS — O24414 Gestational diabetes mellitus in pregnancy, insulin controlled: Secondary | ICD-10-CM

## 2023-07-04 DIAGNOSIS — O10919 Unspecified pre-existing hypertension complicating pregnancy, unspecified trimester: Secondary | ICD-10-CM

## 2023-07-04 DIAGNOSIS — Z3A35 35 weeks gestation of pregnancy: Secondary | ICD-10-CM | POA: Diagnosis not present

## 2023-07-04 DIAGNOSIS — O10913 Unspecified pre-existing hypertension complicating pregnancy, third trimester: Secondary | ICD-10-CM | POA: Diagnosis not present

## 2023-07-04 DIAGNOSIS — O0990 Supervision of high risk pregnancy, unspecified, unspecified trimester: Secondary | ICD-10-CM

## 2023-07-04 NOTE — Progress Notes (Signed)
Patient informed that the ultrasound is considered a limited obstetric ultrasound and is not intended to be a complete ultrasound exam.  Patient also informed that the ultrasound is not being completed with the intent of assessing for fetal or placental anomalies or any pelvic abnormalities. Explained that the purpose of today's ultrasound is to assess for fetal well being.  Patient acknowledges the purpose of the exam and the limitations of the study.      Christine Soliz, RN      

## 2023-07-11 ENCOUNTER — Ambulatory Visit (INDEPENDENT_AMBULATORY_CARE_PROVIDER_SITE_OTHER): Payer: BC Managed Care – PPO | Admitting: Obstetrics & Gynecology

## 2023-07-11 ENCOUNTER — Other Ambulatory Visit (HOSPITAL_COMMUNITY)
Admission: RE | Admit: 2023-07-11 | Discharge: 2023-07-11 | Disposition: A | Discharge status: Home / Self Care | Payer: BC Managed Care – PPO | Source: Ambulatory Visit | Attending: Obstetrics & Gynecology | Admitting: Obstetrics & Gynecology

## 2023-07-11 ENCOUNTER — Other Ambulatory Visit: Payer: Self-pay | Admitting: Advanced Practice Midwife

## 2023-07-11 ENCOUNTER — Other Ambulatory Visit (INDEPENDENT_AMBULATORY_CARE_PROVIDER_SITE_OTHER): Payer: BC Managed Care – PPO

## 2023-07-11 ENCOUNTER — Encounter (HOSPITAL_COMMUNITY): Payer: Self-pay

## 2023-07-11 ENCOUNTER — Encounter: Payer: Self-pay | Admitting: Obstetrics & Gynecology

## 2023-07-11 ENCOUNTER — Ambulatory Visit (INDEPENDENT_AMBULATORY_CARE_PROVIDER_SITE_OTHER): Payer: BC Managed Care – PPO | Admitting: *Deleted

## 2023-07-11 ENCOUNTER — Telehealth (HOSPITAL_COMMUNITY): Payer: Self-pay | Admitting: *Deleted

## 2023-07-11 VITALS — BP 137/88 | HR 99 | Wt 248.0 lb

## 2023-07-11 DIAGNOSIS — O10913 Unspecified pre-existing hypertension complicating pregnancy, third trimester: Secondary | ICD-10-CM | POA: Diagnosis not present

## 2023-07-11 DIAGNOSIS — O10919 Unspecified pre-existing hypertension complicating pregnancy, unspecified trimester: Secondary | ICD-10-CM

## 2023-07-11 DIAGNOSIS — Z3493 Encounter for supervision of normal pregnancy, unspecified, third trimester: Secondary | ICD-10-CM | POA: Diagnosis not present

## 2023-07-11 DIAGNOSIS — Z3A36 36 weeks gestation of pregnancy: Secondary | ICD-10-CM | POA: Insufficient documentation

## 2023-07-11 DIAGNOSIS — O24414 Gestational diabetes mellitus in pregnancy, insulin controlled: Secondary | ICD-10-CM

## 2023-07-11 DIAGNOSIS — O0993 Supervision of high risk pregnancy, unspecified, third trimester: Secondary | ICD-10-CM

## 2023-07-11 DIAGNOSIS — O0990 Supervision of high risk pregnancy, unspecified, unspecified trimester: Secondary | ICD-10-CM

## 2023-07-11 DIAGNOSIS — O99210 Obesity complicating pregnancy, unspecified trimester: Secondary | ICD-10-CM

## 2023-07-11 MED ORDER — INSULIN NPH (HUMAN) (ISOPHANE) 100 UNIT/ML ~~LOC~~ SUSP
8.0000 [IU] | Freq: Every day | SUBCUTANEOUS | Status: DC
Start: 2023-07-11 — End: 2023-07-18

## 2023-07-11 NOTE — Telephone Encounter (Signed)
Preadmission screen  

## 2023-07-11 NOTE — Progress Notes (Signed)
Patient informed that the ultrasound is considered a limited obstetric ultrasound and is not intended to be a complete ultrasound exam.  Patient also informed that the ultrasound is not being completed with the intent of assessing for fetal or placental anomalies or any pelvic abnormalities. Explained that the purpose of today's ultrasound is to assess for fetal well being.  Patient acknowledges the purpose of the exam and the limitations of the study.      Christine Soliz, RN      

## 2023-07-11 NOTE — Addendum Note (Signed)
Addended by: Jaynie Collins A on: 07/11/2023 11:26 AM   Modules accepted: Orders

## 2023-07-11 NOTE — Progress Notes (Signed)
Fastings are running around 95-100's  Has not been feeling good last few days as well

## 2023-07-11 NOTE — Progress Notes (Addendum)
PRENATAL VISIT NOTE  Subjective:  Tina Dixon is a 32 y.o. G2P1001 at [redacted]w[redacted]d being seen today for ongoing prenatal care.  She is currently monitored for the following issues for this high-risk pregnancy and has Chronic hypertension during pregnancy; Insulin controlled gestational diabetes mellitus (GDM) in third trimester; Anxiety and depression; Morbid obesity with body mass index (BMI) of 40.0 to 44.9 in adult [facility]); Supervision of high risk pregnancy, antepartum; and Short interval between pregnancies affecting pregnancy in first trimester, antepartum on their problem list.  Patient reports  relatively decreased fetal movement for the past few days, but thisdid pick up during monitoring today . Patient denies any headaches, visual symptoms, RUQ/epigastric pain or other concerning symptoms. Just feels tired, does not feel good.  Contractions: Irregular. Vag. Bleeding: None.  Movement: Present. Denies leaking of fluid.   The following portions of the patient's history were reviewed and updated as appropriate: allergies, current medications, past family history, past medical history, past social history, past surgical history and problem list.   Objective:   Vitals:   07/11/23 1013 07/11/23 1057  BP: (!) 134/90 137/88  Pulse: 99   Weight: 248 lb (112.5 kg)     Fetal Status: Fetal Heart Rate (bpm): NST   Movement: Present     General:  Alert, oriented and cooperative. Patient is in no acute distress.  Skin: Skin is warm and dry. No rash noted.   Cardiovascular: Normal heart rate noted  Respiratory: Normal respiratory effort, no problems with respiration noted  Abdomen: Soft, gravid, appropriate for gestational age.  Pain/Pressure: Present     Pelvic: Cultures obtained the presence of a chaperone        Extremities: Normal range of motion.     Mental Status: Normal mood and affect. Normal behavior. Normal judgment and thought content.    Imaging: US FETAL BPP W/NONSTRESS  Result  Date: 07/04/2023 ----------------------------------------------------------------------  OBSTETRICS REPORT                       (Signed Final 07/04/2023 01:31 pm) ---------------------------------------------------------------------- Patient Info  ID #:       [id number]                          D.O.B.:  June 22, 1991 (32 yrs)  Name:       Tina Dixon                    Visit Date: 07/04/2023 10:29 am ---------------------------------------------------------------------- Performed By  Attending:        Tinnie Gens MD         Ref. Address:     [address]                                                             [address]  Performed By:     Scheryl Marten RN     [facility]  Referred By:      [facility]ry ---------------------------------------------------------------------- Orders  #  Description                           Code        Ordered By  1  US FETAL BPP W/NONSTRESS              40981.1     Tinnie Gens ----------------------------------------------------------------------  #  Order #                     Accession #                Episode #  1  [id number]                   [id number]                 [id number] ---------------------------------------------------------------------- Indications  [redacted] weeks gestation of pregnancy                Z3A.35  Gestational diabetes in pregnancy, insulin     O24.414  controlled  Hypertension - Chronic/Pre-existing            O10.019 ---------------------------------------------------------------------- Fetal Evaluation  Num Of Fetuses:         1  Cardiac Activity:       Observed  Presentation:           Cephalic  Amniotic Fluid  AFI FV:      Within normal limits  AFI Sum(cm)     %Tile       Largest Pocket(cm)  13.84           49          4.52  RUQ(cm)        RLQ(cm)       LUQ(cm)        LLQ(cm)  4.52          4.16          2.35           2.81 ---------------------------------------------------------------------- Biophysical Evaluation  Amniotic F.V:   Within normal limits       F. Tone:        Observed  F. Movement:    Observed                   N.S.T:          Reactive  F. Breathing:   Observed                   Score:          10/10 ---------------------------------------------------------------------- OB History  Blood Type:   A+  Gravidity:    2         Term:   1        Prem:   0        SAB:   0  TOP:          0       Ectopic:  0        Living: 1 ---------------------------------------------------------------------- Gestational Age  LMP:           35w 3d        Date:  10/29/22                 EDD:   08/05/23  Best:          Consuello Closs 3d     Det. By:  LMP  (10/29/22)          EDD:   08/05/23 ---------------------------------------------------------------------- Impression  IUP @ 35 weeks  GDM A2 on insulin  CHTN on no meds  BPP 8/8 ---------------------------------------------------------------------- Recommendations  Continue weekly antenatal testing till delivery . ----------------------------------------------------------------------                  Tinnie Gens, MD Electronically Signed Final Report   07/04/2023 01:31 pm ----------------------------------------------------------------------   [place] MFM FETAL BPP WO NON STRESS  Result Date: 06/25/2023 ----------------------------------------------------------------------  OBSTETRICS REPORT                       (Signed Final 06/25/2023 04:18 pm) ---------------------------------------------------------------------- Patient Info  ID #:       [id number]                          D.O.B.:  04-May-1991 (32 yrs)  Name:       Tina Dixon                    Visit Date: 06/25/2023 03:45 pm ---------------------------------------------------------------------- Performed By  Attending:        Braxton Feathers DO       Ref. Address:      [address]                                                             [address]  Performed By:     Alain Marion     [facility]  Referred By:      [facility] [place]ry ---------------------------------------------------------------------- Orders  #  Description                           Code        Ordered By  1  [place] MFM FETAL BPP WO NON               76819.01    RAVI SHANKAR     STRESS  2  [place] MFM OB FOLLOW UP                   [id number]    RAVI [facility] ----------------------------------------------------------------------  #  Order #                     Accession #                Episode #  1  [id number]                   [id number]                 [id number]  2  [id number]                   [id number]                 [id number] ---------------------------------------------------------------------- Indications  Gestational diabetes in pregnancy, insulin     O24.414  controlled  Hypertension - Chronic/Pre-existing            O10.019  Short interval between pregnancies             O09.899  complicating pregnancy, antepartum  Obesity complicating pregnancy, third          O99.213  trimester ( pre-G BMI 38 )  [redacted] weeks gestation of pregnancy                Z3A.34  LR NIPS, AFP negative ---------------------------------------------------------------------- Fetal Evaluation  Num Of Fetuses:         1  Fetal Heart Rate(bpm):  149  Cardiac Activity:       Observed  Presentation:           Cephalic  Placenta:               Anterior  P. Cord Insertion:      Previously seen  AFI Sum(cm)     %Tile       Largest Pocket(cm)  15.44           55          5.35  RUQ(cm)       RLQ(cm)       LUQ(cm)        LLQ(cm)  5.29          1.28          5.35           3.52  ---------------------------------------------------------------------- Biophysical Evaluation  Amniotic F.V:   Within normal limits       F. Tone:        Observed  F. Movement:    Observed                   Score:          8/8  F. Breathing:   Observed ---------------------------------------------------------------------- Biometry  BPD:        86  mm  G. Age:  34w 5d         64  %    CI:        76.77   %    70 - 86                                                          FL/HC:      20.5   %    19.4 - 21.8  HC:      310.9  mm     G. Age:  34w 5d         29  %    HC/AC:      1.00        0.96 - 1.11  AC:      312.2  mm     G. Age:  35w 1d         81  %    FL/BPD:     74.0   %    71 - 87  FL:       63.6  mm     G. Age:  32w 6d         12  %    FL/AC:      20.4   %    20 - 24  Est. FW:    2434  gm      5 lb 6 oz     53  % ---------------------------------------------------------------------- OB History  Blood Type:   A+  Gravidity:    2         Term:   1        Prem:   0        SAB:   0  TOP:          0       Ectopic:  0        Living: 1 ---------------------------------------------------------------------- Gestational Age  LMP:           34w 1d        Date:  10/29/22                 EDD:   08/05/23  U/S Today:     34w 3d                                        EDD:   08/03/23  Best:          34w 1d     Det. By:  LMP  (10/29/22)          EDD:   08/05/23 ---------------------------------------------------------------------- Anatomy  Cranium:               Appears normal         Aortic Arch:            Previously seen  Cavum:                 Previously seen        Ductal Arch:            Previously seen  Ventricles:            Previously seen  Diaphragm:              Appears normal  Choroid Plexus:        Previously seen        Stomach:                Appears normal, left                                                                        sided  Cerebellum:            Previously seen        Abdomen:                 Appears normal  Posterior Fossa:       Previously seen        Abdominal Wall:         Previously seen  Nuchal Fold:           Previously seen        Cord Vessels:           Previously seen  Face:                  Orbits and profile     Kidneys:                Appear normal                         previously seen  Lips:                  Previously seen        Bladder:                Appears normal  Thoracic:              Previously seen        Spine:                  Previously seen  Heart:                 Appears normal         Upper Extremities:      Previously seen                         (4CH, axis, and                         situs)  RVOT:                  Appears normal         Lower Extremities:      Previously seen  LVOT:                  Appears normal  Other:  Previously visualized; heels/feet, open hands/5th digits, nasal bone,          lenses, maxilla, mandible, falx, VC, 3VV and 3VTV visualized ---------------------------------------------------------------------- Comments  The patient is here for a follow-up BPP and growth ultrasound  at 34w 1d. EDD: 08/05/2023 dated by LMP  (10/29/22).  Sonographic findings  Single  intrauterine pregnancy.  Fetal cardiac activity: Observed.  Presentation: Cephalic.  Interval fetal anatomy appears normal.  Fetal biometry shows the estimated fetal weight at the 53  percentile.  Amniotic fluid volume: . AFI: 15.44 cm.  MVP: 5.35 cm.  Placenta: Anterior.  BPP: 8/8.  Recommendations  1. Weekly antenatal testing until delivery  2. Growth ultrasounds every 4-6 weeks until delivery  3. Delivery timing around 39 weeks of glucose is well  controlled or 37-38 weeks if she has poor control  4. Continue to f/u with OB provider to review glucose log and  adjust insulin as needed ----------------------------------------------------------------------                  Braxton Feathers, DO Electronically Signed Final Report   06/25/2023 04:18 pm  ----------------------------------------------------------------------   [place] MFM OB FOLLOW UP  Result Date: 06/25/2023 ----------------------------------------------------------------------  OBSTETRICS REPORT                       (Signed Final 06/25/2023 04:18 pm) ---------------------------------------------------------------------- Patient Info  ID #:       [id number]                          D.O.B.:  May 02, 1991 (32 yrs)  Name:       Tina Dixon                    Visit Date: 06/25/2023 03:45 pm ---------------------------------------------------------------------- Performed By  Attending:        Braxton Feathers DO       Ref. Address:     [address]                                                             [address]  Performed By:     Alain Marion     [facility]  Referred By:      [facility] ---------------------------------------------------------------------- Orders  #  Description                           Code  Ordered By  1  [place] MFM FETAL BPP WO NON               [id number]    RAVI SHANKAR     STRESS  2  [place] MFM OB FOLLOW UP                   [id number]    RAVI [facility] ----------------------------------------------------------------------  #  Order #                     Accession #                Episode #  1  [id number]                   [id number]                 [id number]  2  [id number]                   [id number]                 [id number] ---------------------------------------------------------------------- Indications  Gestational diabetes in pregnancy, insulin     O24.414  controlled  Hypertension - Chronic/Pre-existing            O10.019  Short interval between pregnancies             O09.899  complicating pregnancy, antepartum  Obesity complicating  pregnancy, third          O99.213  trimester ( pre-G BMI 38 )  [redacted] weeks gestation of pregnancy                Z3A.34  LR NIPS, AFP negative ---------------------------------------------------------------------- Fetal Evaluation  Num Of Fetuses:         1  Fetal Heart Rate(bpm):  149  Cardiac Activity:       Observed  Presentation:           Cephalic  Placenta:               Anterior  P. Cord Insertion:      Previously seen  AFI Sum(cm)     %Tile       Largest Pocket(cm)  15.44           55          5.35  RUQ(cm)       RLQ(cm)       LUQ(cm)        LLQ(cm)  5.29          1.28          5.35           3.52 ---------------------------------------------------------------------- Biophysical Evaluation  Amniotic F.V:   Within normal limits       F. Tone:        Observed  F. Movement:    Observed                   Score:          8/8  F. Breathing:   Observed ---------------------------------------------------------------------- Biometry  BPD:        86  mm     G. Age:  34w 5d         64  %    CI:        76.77   %    70 - 86  FL/HC:      20.5   %    19.4 - 21.8  HC:      310.9  mm     G. Age:  34w 5d         29  %    HC/AC:      1.00        0.96 - 1.11  AC:      312.2  mm     G. Age:  35w 1d         81  %    FL/BPD:     74.0   %    71 - 87  FL:       63.6  mm     G. Age:  32w 6d         12  %    FL/AC:      20.4   %    20 - 24  Est. FW:    2434  gm      5 lb 6 oz     53  % ---------------------------------------------------------------------- OB History  Blood Type:   A+  Gravidity:    2         Term:   1        Prem:   0        SAB:   0  TOP:          0       Ectopic:  0        Living: 1 ---------------------------------------------------------------------- Gestational Age  LMP:           34w 1d        Date:  10/29/22                 EDD:   08/05/23  U/S Today:     34w 3d                                        EDD:   08/03/23  Best:          34w 1d     Det. By:  LMP   (10/29/22)          EDD:   08/05/23 ---------------------------------------------------------------------- Anatomy  Cranium:               Appears normal         Aortic Arch:            Previously seen  Cavum:                 Previously seen        Ductal Arch:            Previously seen  Ventricles:            Previously seen        Diaphragm:              Appears normal  Choroid Plexus:        Previously seen        Stomach:                Appears normal, left  sided  Cerebellum:            Previously seen        Abdomen:                Appears normal  Posterior Fossa:       Previously seen        Abdominal Wall:         Previously seen  Nuchal Fold:           Previously seen        Cord Vessels:           Previously seen  Face:                  Orbits and profile     Kidneys:                Appear normal                         previously seen  Lips:                  Previously seen        Bladder:                Appears normal  Thoracic:              Previously seen        Spine:                  Previously seen  Heart:                 Appears normal         Upper Extremities:      Previously seen                         (4CH, axis, and                         situs)  RVOT:                  Appears normal         Lower Extremities:      Previously seen  LVOT:                  Appears normal  Other:  Previously visualized; heels/feet, open hands/5th digits, nasal bone,          lenses, maxilla, mandible, falx, VC, 3VV and 3VTV visualized ---------------------------------------------------------------------- Comments  The patient is here for a follow-up BPP and growth ultrasound  at 34w 1d. EDD: 08/05/2023 dated by LMP  (10/29/22).  Sonographic findings  Single intrauterine pregnancy.  Fetal cardiac activity: Observed.  Presentation: Cephalic.  Interval fetal anatomy appears normal.  Fetal biometry shows the estimated fetal weight at the 53   percentile.  Amniotic fluid volume: . AFI: 15.44 cm.  MVP: 5.35 cm.  Placenta: Anterior.  BPP: 8/8.  Recommendations  1. Weekly antenatal testing until delivery  2. Growth ultrasounds every 4-6 weeks until delivery  3. Delivery timing around 39 weeks of glucose is well  controlled or 37-38 weeks if she has poor control  4. Continue to f/u with OB provider to review glucose log and  adjust insulin as needed ----------------------------------------------------------------------                  Braxton Feathers, DO  Electronically Signed Final Report   06/25/2023 04:18 pm ----------------------------------------------------------------------   US FETAL BPP W/NONSTRESS  Result Date: 06/21/2023 ----------------------------------------------------------------------  OBSTETRICS REPORT                       (Signed Final 06/21/2023 11:47 am) ---------------------------------------------------------------------- Patient Info  ID #:       [id number]                          D.O.B.:  [birth year] (32 yrs)  Name:       Tina Dixon                    Visit Date: 06/20/2023 11:42 am ---------------------------------------------------------------------- Performed By  Attending:        Tinnie Gens MD         Ref. Address:     [address]                                                             [address]  Performed By:     Scheryl Marten RN     [facility]         [facility]  Referred By:      [facility] Mila Merry ---------------------------------------------------------------------- Orders  #  Description                           Code        Ordered By  1  US FETAL BPP W/NONSTRESS              24401.0     Tinnie Gens ----------------------------------------------------------------------  #  Order #                     Accession #                 Episode #  1  [id number]                   [id number]                 [id number] ---------------------------------------------------------------------- Indications  [redacted]  weeks gestation of pregnancy                Z3A.33  Gestational diabetes in pregnancy, insulin     O24.414  controlled ---------------------------------------------------------------------- Fetal Evaluation  Num Of Fetuses:         1  Cardiac Activity:       Observed  Presentation:           Cephalic  AFI Sum(cm)     %Tile       Largest Pocket(cm)  12.93           40          4.66  RUQ(cm)       RLQ(cm)       LUQ(cm)        LLQ(cm)  4.66          4.61          1.83           1.83 ---------------------------------------------------------------------- Biophysical Evaluation  Amniotic F.V:   Within normal limits       F. Tone:        Observed  F. Movement:    Observed                   N.S.T:          Reactive  F. Breathing:   Not Observed               Score:          8/10 ---------------------------------------------------------------------- OB History  Blood Type:   A+  Gravidity:    2         Term:   1        Prem:   0        SAB:   0  TOP:          0       Ectopic:  0        Living: 1 ---------------------------------------------------------------------- Gestational Age  LMP:           33w 3d        Date:  10/29/22                 EDD:   08/05/23  Best:          33w 3d     Det. By:  LMP  (10/29/22)          EDD:   08/05/23 ---------------------------------------------------------------------- Impression  IUP @ 33 3//7 weeks  GDM A2 on insulin  CHTN not on meds  BPP 6/8 with reactive NST ---------------------------------------------------------------------- Recommendations  Continue weekly antenatal testing till delivery . ----------------------------------------------------------------------                  Tinnie Gens, MD Electronically Signed Final Report   06/21/2023 11:47 am  ----------------------------------------------------------------------    Assessment and Plan:  Pregnancy: G2P1001 at [redacted]w[redacted]d 1. Insulin controlled gestational diabetes mellitus (GDM) in third trimester Reviewed blood sugars on meter.  Fastings mostly 97-108, a couple of elevated PP.  Not good control.  Increased NPH to 8 units at bedtime. Discussed MFM recommendations for IOL at 37-38 weeks, she desires 37 weeks.  IOL scheduled at [redacted]w[redacted]d, 9/1/124 morning. Orders signed and held. NST performed today was reviewed and was found to be reactive. Subsequent BPP performed today was also reviewed and was found to be 10/10. AFI was also normal.  - insulin NPH Human (NOVOLIN N) 100 UNIT/ML injection; Inject  0.08 mLs (8 Units total) into the skin at bedtime.  2. Chronic hypertension during pregnancy PEC precautions reviewed. Will check labs today given DBP of 90, recheck normal.  - CBC - Comprehensive metabolic panel - Protein / creatinine ratio, urine  3. Maternal morbid obesity, antepartum (HCC) TWG 17 lbs.  4. [redacted] weeks gestation of pregnancy 5. Supervision of high risk pregnancy, antepartum Pelvic cultures done today, will follow up results and manage accordingly. - Cervicovaginal ancillary only - Strep Gp B NAA  Preterm labor symptoms and general obstetric precautions including but not limited to vaginal bleeding, contractions, leaking of fluid and fetal movement were reviewed in detail with the patient. Please refer to After Visit Summary for other counseling recommendations.   Return for Postpartum follow up (IOL scheduled 07/15/23).  Future Appointments  Date Time Provider Department Center  07/15/2023  6:45 AM [facility] SCHED ROOM [facility] None    Jaynie Collins, MD

## 2023-07-12 LAB — PROTEIN / CREATININE RATIO, URINE
Creatinine, Urine: 197.7 mg/dL
Protein, Ur: 41.4 mg/dL
Protein/Creat Ratio: 209 mg/g{creat} — ABNORMAL HIGH (ref 0–200)

## 2023-07-12 LAB — CBC
Hematocrit: 36.2 % (ref 34.0–46.6)
Hemoglobin: 11.5 g/dL (ref 11.1–15.9)
MCH: 27.3 pg (ref 26.6–33.0)
MCHC: 31.8 g/dL (ref 31.5–35.7)
MCV: 86 fL (ref 79–97)
Platelets: 296 10*3/uL (ref 150–450)
RBC: 4.21 x10E6/uL (ref 3.77–5.28)
RDW: 12.9 % (ref 11.7–15.4)
WBC: 9.6 10*3/uL (ref 3.4–10.8)

## 2023-07-12 LAB — COMPREHENSIVE METABOLIC PANEL
ALT: 11 IU/L (ref 0–32)
AST: 13 IU/L (ref 0–40)
Albumin: 3.4 g/dL — ABNORMAL LOW (ref 3.9–4.9)
Alkaline Phosphatase: 176 IU/L — ABNORMAL HIGH (ref 44–121)
BUN/Creatinine Ratio: 8 — ABNORMAL LOW (ref 9–23)
BUN: 5 mg/dL — ABNORMAL LOW (ref 6–20)
Bilirubin Total: 0.3 mg/dL (ref 0.0–1.2)
CO2: 18 mmol/L — ABNORMAL LOW (ref 20–29)
Calcium: 8.8 mg/dL (ref 8.7–10.2)
Chloride: 104 mmol/L (ref 96–106)
Creatinine, Ser: 0.6 mg/dL (ref 0.57–1.00)
Globulin, Total: 3.1 g/dL (ref 1.5–4.5)
Glucose: 91 mg/dL (ref 70–99)
Potassium: 4.3 mmol/L (ref 3.5–5.2)
Sodium: 138 mmol/L (ref 134–144)
Total Protein: 6.5 g/dL (ref 6.0–8.5)
eGFR: 123 mL/min/{1.73_m2} (ref >59)

## 2023-07-13 ENCOUNTER — Encounter: Payer: Self-pay | Admitting: Obstetrics & Gynecology

## 2023-07-13 ENCOUNTER — Encounter (HOSPITAL_COMMUNITY): Payer: Self-pay | Admitting: *Deleted

## 2023-07-13 ENCOUNTER — Telehealth (HOSPITAL_COMMUNITY): Payer: Self-pay | Admitting: *Deleted

## 2023-07-13 LAB — STREP GP B NAA: Strep Gp B NAA: NEGATIVE

## 2023-07-13 NOTE — Telephone Encounter (Signed)
Preadmission screen  

## 2023-07-15 ENCOUNTER — Inpatient Hospital Stay (HOSPITAL_COMMUNITY): Payer: BC Managed Care – PPO | Admitting: Anesthesiology

## 2023-07-15 ENCOUNTER — Encounter (HOSPITAL_COMMUNITY): Payer: Self-pay | Admitting: Obstetrics & Gynecology

## 2023-07-15 ENCOUNTER — Other Ambulatory Visit: Payer: Self-pay

## 2023-07-15 ENCOUNTER — Inpatient Hospital Stay (HOSPITAL_COMMUNITY)
Admission: RE | Admit: 2023-07-15 | Discharge: 2023-07-18 | DRG: 805 | Disposition: A | Discharge status: Home / Self Care | Payer: BC Managed Care – PPO | Attending: Obstetrics and Gynecology | Admitting: Obstetrics and Gynecology

## 2023-07-15 ENCOUNTER — Inpatient Hospital Stay (HOSPITAL_COMMUNITY): Payer: BC Managed Care – PPO

## 2023-07-15 DIAGNOSIS — Z2989 Encounter for other specified prophylactic measures: Secondary | ICD-10-CM | POA: Diagnosis not present

## 2023-07-15 DIAGNOSIS — O99344 Other mental disorders complicating childbirth: Secondary | ICD-10-CM | POA: Diagnosis present

## 2023-07-15 DIAGNOSIS — I952 Hypotension due to drugs: Secondary | ICD-10-CM | POA: Diagnosis not present

## 2023-07-15 DIAGNOSIS — F419 Anxiety disorder, unspecified: Secondary | ICD-10-CM | POA: Diagnosis present

## 2023-07-15 DIAGNOSIS — O326XX0 Maternal care for compound presentation, not applicable or unspecified: Secondary | ICD-10-CM | POA: Diagnosis present

## 2023-07-15 DIAGNOSIS — Z4682 Encounter for fitting and adjustment of non-vascular catheter: Secondary | ICD-10-CM | POA: Diagnosis not present

## 2023-07-15 DIAGNOSIS — O24414 Gestational diabetes mellitus in pregnancy, insulin controlled: Principal | ICD-10-CM | POA: Diagnosis present

## 2023-07-15 DIAGNOSIS — O9A22 Injury, poisoning and certain other consequences of external causes complicating childbirth: Secondary | ICD-10-CM | POA: Diagnosis not present

## 2023-07-15 DIAGNOSIS — O10919 Unspecified pre-existing hypertension complicating pregnancy, unspecified trimester: Secondary | ICD-10-CM | POA: Diagnosis present

## 2023-07-15 DIAGNOSIS — Z3A37 37 weeks gestation of pregnancy: Secondary | ICD-10-CM | POA: Diagnosis not present

## 2023-07-15 DIAGNOSIS — R079 Chest pain, unspecified: Secondary | ICD-10-CM | POA: Diagnosis not present

## 2023-07-15 DIAGNOSIS — O1092 Unspecified pre-existing hypertension complicating childbirth: Secondary | ICD-10-CM | POA: Diagnosis not present

## 2023-07-15 DIAGNOSIS — Z794 Long term (current) use of insulin: Secondary | ICD-10-CM | POA: Diagnosis not present

## 2023-07-15 DIAGNOSIS — Z79899 Other long term (current) drug therapy: Secondary | ICD-10-CM

## 2023-07-15 DIAGNOSIS — O165 Unspecified maternal hypertension, complicating the puerperium: Secondary | ICD-10-CM | POA: Diagnosis not present

## 2023-07-15 DIAGNOSIS — F32A Depression, unspecified: Secondary | ICD-10-CM | POA: Diagnosis not present

## 2023-07-15 DIAGNOSIS — O99893 Other specified diseases and conditions complicating puerperium: Secondary | ICD-10-CM | POA: Diagnosis not present

## 2023-07-15 DIAGNOSIS — O2412 Pre-existing diabetes mellitus, type 2, in childbirth: Secondary | ICD-10-CM | POA: Diagnosis present

## 2023-07-15 DIAGNOSIS — O1002 Pre-existing essential hypertension complicating childbirth: Secondary | ICD-10-CM | POA: Diagnosis not present

## 2023-07-15 DIAGNOSIS — Z23 Encounter for immunization: Secondary | ICD-10-CM | POA: Diagnosis not present

## 2023-07-15 DIAGNOSIS — Z7982 Long term (current) use of aspirin: Secondary | ICD-10-CM | POA: Diagnosis not present

## 2023-07-15 DIAGNOSIS — Z2981 Encounter for HIV pre-exposure prophylaxis: Secondary | ICD-10-CM | POA: Diagnosis not present

## 2023-07-15 DIAGNOSIS — E119 Type 2 diabetes mellitus without complications: Secondary | ICD-10-CM | POA: Diagnosis not present

## 2023-07-15 DIAGNOSIS — O99214 Obesity complicating childbirth: Secondary | ICD-10-CM | POA: Diagnosis present

## 2023-07-15 DIAGNOSIS — O24424 Gestational diabetes mellitus in childbirth, insulin controlled: Secondary | ICD-10-CM | POA: Diagnosis not present

## 2023-07-15 LAB — CBC
HCT: 36.2 % (ref 36.0–46.0)
Hemoglobin: 11.7 g/dL — ABNORMAL LOW (ref 12.0–15.0)
MCH: 28.5 pg (ref 26.0–34.0)
MCHC: 32.3 g/dL (ref 30.0–36.0)
MCV: 88.1 fL (ref 80.0–100.0)
Platelets: 322 10*3/uL (ref 150–400)
RBC: 4.11 MIL/uL (ref 3.87–5.11)
RDW: 13.2 % (ref 11.5–15.5)
WBC: 10.4 10*3/uL (ref 4.0–10.5)
nRBC: 0 % (ref 0.0–0.2)

## 2023-07-15 LAB — COMPREHENSIVE METABOLIC PANEL
ALT: 16 U/L (ref 0–44)
AST: 15 U/L (ref 15–41)
Albumin: 2.4 g/dL — ABNORMAL LOW (ref 3.5–5.0)
Alkaline Phosphatase: 143 U/L — ABNORMAL HIGH (ref 38–126)
Anion gap: 14 (ref 5–15)
BUN: 5 mg/dL — ABNORMAL LOW (ref 6–20)
CO2: 16 mmol/L — ABNORMAL LOW (ref 22–32)
Calcium: 8.2 mg/dL — ABNORMAL LOW (ref 8.9–10.3)
Chloride: 102 mmol/L (ref 98–111)
Creatinine, Ser: 0.61 mg/dL (ref 0.44–1.00)
GFR, Estimated: >60 mL/min (ref >60)
Glucose, Bld: 105 mg/dL — ABNORMAL HIGH (ref 70–99)
Potassium: 3.7 mmol/L (ref 3.5–5.1)
Sodium: 132 mmol/L — ABNORMAL LOW (ref 135–145)
Total Bilirubin: 0.5 mg/dL (ref 0.3–1.2)
Total Protein: 6.5 g/dL (ref 6.5–8.1)

## 2023-07-15 LAB — CBC WITH DIFFERENTIAL/PLATELET
Abs Immature Granulocytes: 0.06 10*3/uL (ref 0.00–0.07)
Basophils Absolute: 0.1 10*3/uL (ref 0.0–0.1)
Basophils Relative: 0 %
Eosinophils Absolute: 0 10*3/uL (ref 0.0–0.5)
Eosinophils Relative: 0 %
HCT: 36.8 % (ref 36.0–46.0)
Hemoglobin: 11.8 g/dL — ABNORMAL LOW (ref 12.0–15.0)
Immature Granulocytes: 0 %
Lymphocytes Relative: 15 %
Lymphs Abs: 2.2 10*3/uL (ref 0.7–4.0)
MCH: 27.9 pg (ref 26.0–34.0)
MCHC: 32.1 g/dL (ref 30.0–36.0)
MCV: 87 fL (ref 80.0–100.0)
Monocytes Absolute: 1 10*3/uL (ref 0.1–1.0)
Monocytes Relative: 7 %
Neutro Abs: 11.8 10*3/uL — ABNORMAL HIGH (ref 1.7–7.7)
Neutrophils Relative %: 78 %
Platelets: 328 10*3/uL (ref 150–400)
RBC: 4.23 MIL/uL (ref 3.87–5.11)
RDW: 13.3 % (ref 11.5–15.5)
WBC: 15.1 10*3/uL — ABNORMAL HIGH (ref 4.0–10.5)
nRBC: 0 % (ref 0.0–0.2)

## 2023-07-15 LAB — GLUCOSE, CAPILLARY
Glucose-Capillary: 83 mg/dL (ref 70–99)
Glucose-Capillary: 77 mg/dL (ref 70–99)
Glucose-Capillary: 95 mg/dL (ref 70–99)

## 2023-07-15 LAB — TYPE AND SCREEN
ABO/RH(D): A POS
Antibody Screen: NEGATIVE

## 2023-07-15 LAB — RPR: RPR Ser Ql: NONREACTIVE

## 2023-07-15 MED ORDER — ACETAMINOPHEN 325 MG PO TABS: 650.0000 mg | ORAL_TABLET | ORAL | Status: DC | PRN

## 2023-07-15 MED ORDER — PHENYLEPHRINE 80 MCG/ML (10ML) SYRINGE FOR IV PUSH (FOR BLOOD PRESSURE SUPPORT): 80.0000 ug | PREFILLED_SYRINGE | INTRAVENOUS | Status: DC | PRN

## 2023-07-15 MED ORDER — ONDANSETRON HCL 4 MG/2ML IJ SOLN
4.0000 mg | Freq: Four times a day (QID) | INTRAMUSCULAR | Status: DC | PRN
  Administered 2023-07-15: 4 mg via INTRAVENOUS
  Filled 2023-07-15: qty 2

## 2023-07-15 MED ORDER — LACTATED RINGERS IV SOLN: 500.0000 mL | INTRAVENOUS | Status: DC | PRN

## 2023-07-15 MED ORDER — ZOLPIDEM TARTRATE 5 MG PO TABS: 5.0000 mg | ORAL_TABLET | Freq: Every evening | ORAL | Status: DC | PRN

## 2023-07-15 MED ORDER — OXYCODONE-ACETAMINOPHEN 5-325 MG PO TABS: 2.0000 | ORAL_TABLET | ORAL | Status: DC | PRN

## 2023-07-15 MED ORDER — TERBUTALINE SULFATE 1 MG/ML IJ SOLN
0.2500 mg | Freq: Once | INTRAMUSCULAR | Status: AC | PRN
  Administered 2023-07-15: 0.25 mg via SUBCUTANEOUS
  Filled 2023-07-15: qty 1

## 2023-07-15 MED ORDER — FLEET ENEMA RE ENEM: 1.0000 | ENEMA | Freq: Every day | RECTAL | Status: DC | PRN

## 2023-07-15 MED ORDER — OXYTOCIN-SODIUM CHLORIDE 30-0.9 UT/500ML-% IV SOLN: 2.5000 [IU]/h | INTRAVENOUS | Status: DC

## 2023-07-15 MED ORDER — LIDOCAINE HCL (PF) 1 % IJ SOLN: 30.0000 mL | INTRAMUSCULAR | Status: DC | PRN

## 2023-07-15 MED ORDER — HYDROXYZINE HCL 50 MG PO TABS: 50.0000 mg | ORAL_TABLET | Freq: Four times a day (QID) | ORAL | Status: DC | PRN

## 2023-07-15 MED ORDER — SOD CITRATE-CITRIC ACID 500-334 MG/5ML PO SOLN: 30.0000 mL | ORAL | Status: DC | PRN

## 2023-07-15 MED ORDER — MISOPROSTOL 25 MCG QUARTER TABLET
25.0000 ug | ORAL_TABLET | Freq: Once | ORAL | Status: DC
  Filled 2023-07-15: qty 1

## 2023-07-15 MED ORDER — FENTANYL-BUPIVACAINE-NACL 0.5-0.125-0.9 MG/250ML-% EP SOLN
12.0000 mL/h | EPIDURAL | Status: DC | PRN
  Administered 2023-07-15: 12 mL/h via EPIDURAL
  Filled 2023-07-15: qty 250

## 2023-07-15 MED ORDER — LACTATED RINGERS IV SOLN
500.0000 mL | Freq: Once | INTRAVENOUS | Status: AC
  Administered 2023-07-15: 500 mL via INTRAVENOUS

## 2023-07-15 MED ORDER — OXYTOCIN BOLUS FROM INFUSION
333.0000 mL | Freq: Once | INTRAVENOUS | Status: AC
  Administered 2023-07-16: 333 mL via INTRAVENOUS

## 2023-07-15 MED ORDER — DIPHENHYDRAMINE HCL 50 MG/ML IJ SOLN: 12.5000 mg | INTRAMUSCULAR | Status: DC | PRN

## 2023-07-15 MED ORDER — EPHEDRINE 5 MG/ML INJ: 10.0000 mg | INTRAVENOUS | Status: DC | PRN

## 2023-07-15 MED ORDER — OXYCODONE-ACETAMINOPHEN 5-325 MG PO TABS: 1.0000 | ORAL_TABLET | ORAL | Status: DC | PRN

## 2023-07-15 MED ORDER — FENTANYL CITRATE (PF) 100 MCG/2ML IJ SOLN
50.0000 ug | INTRAMUSCULAR | Status: DC | PRN
  Administered 2023-07-15 (×2): 100 ug via INTRAVENOUS
  Filled 2023-07-15 (×2): qty 2

## 2023-07-15 MED ORDER — LIDOCAINE HCL (PF) 1 % IJ SOLN
INTRAMUSCULAR | Status: DC | PRN
  Administered 2023-07-15: 10 mL via EPIDURAL

## 2023-07-15 MED ORDER — LACTATED RINGERS IV SOLN: INTRAVENOUS | Status: DC

## 2023-07-15 MED ORDER — PHENYLEPHRINE 80 MCG/ML (10ML) SYRINGE FOR IV PUSH (FOR BLOOD PRESSURE SUPPORT)
80.0000 ug | PREFILLED_SYRINGE | INTRAVENOUS | Status: DC | PRN
  Administered 2023-07-15: 80 ug via INTRAVENOUS
  Filled 2023-07-15: qty 10

## 2023-07-15 MED ORDER — OXYTOCIN-SODIUM CHLORIDE 30-0.9 UT/500ML-% IV SOLN
1.0000 m[IU]/min | INTRAVENOUS | Status: DC
  Administered 2023-07-15: 2 m[IU]/min via INTRAVENOUS
  Filled 2023-07-15: qty 500

## 2023-07-15 MED ORDER — MISOPROSTOL 50MCG HALF TABLET
50.0000 ug | ORAL_TABLET | Freq: Once | ORAL | Status: DC
  Filled 2023-07-15: qty 1

## 2023-07-15 NOTE — Anesthesia Procedure Notes (Signed)
Epidural Patient location during procedure: OB Start time: 07/15/2023 9:14 PM End time: 07/15/2023 9:17 PM  Staffing Anesthesiologist: Leilani Able, MD Performed: anesthesiologist   Preanesthetic Checklist Completed: patient identified, IV checked, site marked, risks and benefits discussed, surgical consent, monitors and equipment checked, pre-op evaluation and timeout performed  Epidural Patient position: sitting Prep: DuraPrep and site prepped and draped Patient monitoring: continuous pulse ox and blood pressure Approach: midline Location: L3-L4 Injection technique: LOR air  Needle:  Needle type: Tuohy  Needle gauge: 17 G Needle length: 9 cm and 9 Needle insertion depth: 7 cm Catheter type: closed end flexible Catheter size: 19 Gauge Catheter at skin depth: 12 cm Test dose: negative and Other  Assessment Events: blood not aspirated, no cerebrospinal fluid, injection not painful, no injection resistance, no paresthesia and negative IV test

## 2023-07-15 NOTE — Progress Notes (Signed)
Labor Progress Note  Tina Dixon is a 32 y.o. G2P1001 at [redacted]w[redacted]d presented for IOL 2/2 CHT, A2DM.   S: patient comfortably resting in bed, feeling much better, denies sweating, N/V, palpitations.   O:  BP 110/64   Pulse (!) 108   Temp 99.1 F (37.3 C) (Oral)   Resp 18   Ht 5\' 3"  (1.6 m)   Wt 112.4 kg   LMP 10/29/2022   SpO2 99%   BMI 43.90 kg/m  EFM:145bpm/Moderate variability/ 15x15 accels/ None decels CAT: 1 Toco:  q17mins    CVE: Dilation: 4 Effacement (%): 80 Cervical Position: Posterior Station: -2 Presentation: Vertex Exam by:: Cubb MD   A&P: 32 y.o. G2P1001 [redacted]w[redacted]d  here for IOL as above  #Labor: Recovered well now, tolerated AROM, cf.  Mom and FHT doing well after AROM.  #Pain: Epidural #FWB: CAT 1 #GBS negative  #CHTN: not on meds at home - a few mild ranges and one moderate range since last check, asymptomatic - CTM    #A2DM: home NPH 8U at bedtime, holding here given diet restrictions - wnl last 3 checks, admit 105  [name], MD FMOB Fellow, Faculty practice [facility], Center for [facility] 07/15/23  11:10 PM

## 2023-07-15 NOTE — Progress Notes (Signed)
Patient ID: Tkai Tiley, female   DOB: [birth year], 32 y.o.   MRN: [id number] LABOR NOTE Nonnie Lebrun is a 32 y.o. G2P1001 at [redacted]w[redacted]d admitted for induction of labor due to chronic hypertension  and diabetes mellitus A2DM   Subjective: Getting more uncomfortable w/ contractions  Objective: BP 129/78   Pulse (!) 108   Temp 98.2 F (36.8 C) (Oral)   Resp 16   Ht 5\' 3"  (1.6 m)   Wt 112.4 kg   LMP 10/29/2022   BMI 43.90 kg/m  No intake/output data recorded.  FHR baseline 125 bpm, Variability: moderate, Accelerations:present, Decelerations:  Present  prolonged while in supine for exam Toco: q 1-4 mins   SVE:   Dilation: 4 Effacement (%): 80 Station: -3, Ballotable Exam by:: Joellyn Haff CNM  Pitocin @ 18 mu/min  Labs: Lab Results  Component Value Date   WBC 10.4 07/15/2023   HGB 11.7 (L) 07/15/2023   HCT 36.2 07/15/2023   MCV 88.1 07/15/2023   PLT 322 07/15/2023    Assessment / Plan: IOL d/t CHTN and A2DM, on pit, prolonged during exam (just got IV fentanyl, in supine and had 3 uc's back to back). Plan AROM when able  Labor: early Fetal Wellbeing:  Category II Pain Control:  IV pain meds Pre-eclampsia: N/A I/D:  GBS neg Anticipated MOD: NSVB  Cheral Marker CNM, WHNP-BC 07/15/2023, 6:59 PM

## 2023-07-15 NOTE — Progress Notes (Signed)
Labor Progress Note  Tina Dixon is a 32 y.o. G2P1001 at [redacted]w[redacted]d presented for IOL 2/2 CHT, A2DM.   S: called to patient's room for prolonged deceleration after epidural 2/2 maternal hypotension (down to 83/40 MAP 45), FHR down to 60s.  Bolus going, s/p phenylephrine @ 2131 (no maternal bradycardia noted) and position changing with little relief. Got to bedside baby down for about , asked for hands/knee position, pit to be stopped.  FHR up to 90s.  Pushed Terb and asked for Dr. Berton Lan to be at bedside.    O:  BP (!) 116/90   Pulse (!) 124   Temp 98.2 F (36.8 C) (Oral)   Resp 18   Ht 5\' 3"  (1.6 m)   Wt 112.4 kg   LMP 10/29/2022   SpO2 98%   BMI 43.90 kg/m  Initial: EFM:90bpm/Mild variability/ none accels/ prolonged decels CAT: 3 Toco: q1-3 mins   Post Interventions: EFM:150bpm/Mild variability/ none accels/ None decels CAT: 2 Toco: none   CVE: Dilation: 4 Effacement (%): 80 Station: -3, Ballotable Presentation: Vertex Exam by:: Forsyth MD   A&P: 32 y.o. G2P1001 [redacted]w[redacted]d  here for IOL as above  #Labor: Prolonged deceleration in s/o maternal hypotension following epidural placement.  Once mother's BP improved (up to 120/50 MAP 65) FHR also improved.  Bolus still going, s/p Terb.  Unchanged cervical exam.  Will give both mother and baby time to recover before AROM.  #Pain: Epidural #FWB: CAT 2 #GBS negative  #CHTN: not on meds at home - a few mild ranges since admission  - hypotensive following epidural, now s/p phenylephrine @ 2131 - CTM   #A2DM: home NPH 8U at bedtime, holding here given diet restrictions - wnl last two checks, admit 105  [name], MD FMOB Fellow, Faculty practice [facility], Center for [facility] 07/15/23  9:46 PM

## 2023-07-15 NOTE — Anesthesia Preprocedure Evaluation (Signed)
Anesthesia Evaluation  Patient identified by MRN, date of birth, ID band Patient awake    Reviewed: Allergy & Precautions, H&P , NPO status , Patient's Chart, lab work & pertinent test results  History of Anesthesia Complications Negative for: history of anesthetic complications  Airway Mallampati: II       Dental no notable dental hx.    Pulmonary neg pulmonary ROS   Pulmonary exam normal        Cardiovascular hypertension, Normal cardiovascular exam     Neuro/Psych  PSYCHIATRIC DISORDERS Anxiety Depression    negative neurological ROS     GI/Hepatic negative GI ROS, Neg liver ROS,,,  Endo/Other  diabetes, Gestational, Insulin Dependent  Morbid obesity  Renal/GU   negative genitourinary   Musculoskeletal negative musculoskeletal ROS (+)    Abdominal  (+) + obese  Peds  Hematology negative hematology ROS (+)   Anesthesia Other Findings   Reproductive/Obstetrics (+) Pregnancy                             Anesthesia Physical Anesthesia Plan  ASA: 3  Anesthesia Plan: Epidural   Post-op Pain Management:    Induction:   PONV Risk Score and Plan:   Airway Management Planned:   Additional Equipment:   Intra-op Plan:   Post-operative Plan:   Informed Consent: I have reviewed the patients History and Physical, chart, labs and discussed the procedure including the risks, benefits and alternatives for the proposed anesthesia with the patient or authorized representative who has indicated his/her understanding and acceptance.       Plan Discussed with:   Anesthesia Plan Comments:         Anesthesia Quick Evaluation

## 2023-07-15 NOTE — Progress Notes (Signed)
Patient ID: Tina Dixon, female   DOB: Apr 17, 1991, 32 y.o.   MRN: [id number] LABOR NOTE Tina Dixon is a 32 y.o. G2P1001 at [redacted]w[redacted]d admitted for induction of labor due to chronic hypertension  and diabetes mellitus A2DM   Subjective: beginning to get uncomfortable w/ contractions  Objective: BP 126/89   Pulse 99   Temp 98.2 F (36.8 C) (Oral)   Resp 16   Ht 5\' 3"  (1.6 m)   Wt 112.4 kg   LMP 10/29/2022   BMI 43.90 kg/m  No intake/output data recorded.  FHR baseline 135 bpm, Variability: moderate, Accelerations:present, Decelerations:  Absent Toco: q 2-4 mins   SVE:   Dilation: 3.5 Effacement (%): 80 Station: -3 Exam by:: Joellyn Haff CNM  Pitocin @ 12 mu/min  Labs: Lab Results  Component Value Date   WBC 10.4 07/15/2023   HGB 11.7 (L) 07/15/2023   HCT 36.2 07/15/2023   MCV 88.1 07/15/2023   PLT 322 07/15/2023   CBG (last 3)  Recent Labs    07/15/23 1409  GLUCAP 83    Assessment / Plan: IOL d/t CHTN and A2DM, on pit, beginning to get uncomfortable, plan AROM when vtx lower  Labor: early Fetal Wellbeing:  Category I Pain Control:  IV pain meds Pre-eclampsia: N/A I/D:  GBS neg Anticipated MOD: NSVB  Cheral Marker CNM, WHNP-BC 07/15/2023, 3:26 PM

## 2023-07-15 NOTE — H&P (Signed)
LABOR H&P Tina Dixon is a 32 y.o. G2P1001 female at [redacted]w[redacted]d by LMP c/w U/S at 7 wks presenting for IOL d/t CHTN (no meds), A2DM on insulin.  Denies ha, visual changes, ruq/epigastric pain, n/v.   Reports active fetal movement, contractions: none, vaginal bleeding: none, membranes: intact.  Initiated prenatal care at [facility] at 12 wks.   Most recent u/s 8/12 @ 34.1w, EFW 53%/2434g/5lb6oz, vtx, AFI 15cm.   This pregnancy complicated by: CHTN-no meds A2DM on NPH 8u at bedtime Dep/anx- on zoloft and vistaril BMI 43  Prenatal History/Complications:  Uncomplicated SVB x 1 (IOL for CHTN, FGR and A2DM)  Past Medical History: Past Medical History:  Diagnosis Date   Anxiety and depression 07/25/2022   Elevated triglycerides with high cholesterol 07/25/2022   Gestational diabetes    Gestational diabetes mellitus, antepartum 02/08/2022   Pregnancy induced hypertension     Past Surgical History: Past Surgical History:  Procedure Laterality Date   WISDOM TOOTH EXTRACTION      Obstetrical History: OB History  Gravida Para Term Preterm AB Living  2 1 1     1   SAB IAB Ectopic Multiple Live Births        0 1    # Outcome Date GA Lbr Len/2nd Weight Sex Type Anes PTL Lv  2 Current           1 Term 04/07/22 [redacted]w[redacted]d / 01:30 2430 g F Vag-Spont EPI  LIV     Birth Comments: wnl    Social History: Social History   Socioeconomic History   Marital status: Married    Spouse name: Judie Grieve   Number of children: 1   Years of education: Not on file   Highest education level: Not on file  Occupational History   Occupation: Hair Stylist    Comment: hair by [employer]    Employer: [employer]  Tobacco Use   Smoking status: Never   Smokeless tobacco: Never  Vaping Use   Vaping status: Never Used  Substance and Sexual Activity   Alcohol use: Not Currently    Comment: socially   Drug use: No   Sexual activity: Yes    Partners: Male    Birth control/protection: None     Comment: monitor during ovulation cycle  Other Topics Concern   Not on file  Social History Narrative   ** Merged History Encounter **       Social Determinants of Health   Financial Resource Strain: Not on file  Food Insecurity: No Food Insecurity (07/15/2023)   Hunger Vital Sign    Worried About Running Out of Food in the Last Year: Never true    Ran Out of Food in the Last Year: Never true  Transportation Needs: No Transportation Needs (07/15/2023)   PRAPARE - Administrator, Civil Service (Medical): No    Lack of Transportation (Non-Medical): No  Physical Activity: Not on file  Stress: Not on file  Social Connections: Not on file    Allergies: No Known Allergies  Medications Prior to Admission  Medication Sig Dispense Refill Last Dose   Accu-Chek Softclix Lancets lancets 1 each by Other route 4 (four) times daily. 100 each 12 07/14/2023   aspirin 81 MG chewable tablet Chew 1 tablet (81 mg total) by mouth daily. 90 tablet 0 Past Week   Blood Glucose Monitoring Suppl (ACCU-CHEK GUIDE) w/Device KIT 1 Device by Does not apply route 4 (four) times daily. 1 kit 0 07/14/2023  glucose blood (ACCU-CHEK GUIDE) test strip Use to check blood sugars four times a day was instructed 50 each 12 07/14/2023   insulin NPH Human (NOVOLIN N) 100 UNIT/ML injection Inject 0.08 mLs (8 Units total) into the skin at bedtime.   07/14/2023   Insulin Syringes, Disposable, U-100 0.5 ML MISC Use to inject insulin as directed 30 each 3 07/14/2023   sertraline (ZOLOFT) 100 MG tablet Take 1 tablet (100 mg total) by mouth daily. 90 tablet 1 Past Month   fluticasone (FLONASE) 50 MCG/ACT nasal spray Place 2 sprays into both nostrils daily. (Patient not taking: Reported on 06/13/2023) 16 g 6    hydrOXYzine (ATARAX) 10 MG tablet Take 1 tablet (10 mg total) by mouth 3 (three) times daily as needed. (Patient not taking: Reported on 06/13/2023) 30 tablet 0     Review of Systems  Pertinent pos/neg as indicated in  HPI  Blood pressure 136/85, pulse (!) 121, temperature 98.2 F (36.8 C), temperature source Oral, resp. rate 16, last menstrual period 10/29/2022, not currently breastfeeding. General appearance: alert, cooperative, and no distress Lungs: clear to auscultation bilaterally Heart: regular rate and rhythm Abdomen: gravid, soft, non-tender Extremities: tr edema  Fetal monitoring: FHR: 150 bpm, variability: moderate,  Accelerations: Present,  decelerations:  Absent Uterine activity: none SVE: Dilation: 3 Effacement (%): 80 Station: -3 Exam by:: Joellyn Haff CNM Presentation: cephalic   Prenatal labs: ABO, Rh: --/--/PENDING (09/01 1025) Antibody: PENDING (09/01 1025) Rubella: 1.77 (03/13 1611) RPR: Non Reactive (07/03 0853)  HBsAg: Negative (03/13 1611)  HIV: Non Reactive (07/03 0853)  HepC: Non Reactive (03/13 1611) GBS: Negative/-- (08/28 1100)  2hr GTT: abnormal  Results for orders placed or performed during the hospital encounter of 07/15/23 (from the past 24 hour(s))  Type and screen   Collection Time: 07/15/23 10:25 AM  Result Value Ref Range   ABO/RH(D) PENDING    Antibody Screen PENDING    Sample Expiration      07/18/2023,2359 Performed at [facility], [address] [place], [place] [address]      Assessment:  [redacted]w[redacted]d SIUP  G2P1001  A2DM-on insulin, EFW 53% w/ normal AFI  CHTN-no meds  BMI 43  Dep/anx  Cat 1 FHR  GBS Negative/-- (08/28 1100)  Plan:  Admit to L&D  IV pain meds/epidural prn active labor  Pitocin per protocol  Planned mode of delivery: NSVB   Planned infant feeding: bottlefeeding  Planned contraception: Nexplanon at pp visit    Circumcision: yes  Cheral Marker CNM, WHNP-BC 07/15/2023, 10:59 AM

## 2023-07-16 ENCOUNTER — Encounter (HOSPITAL_COMMUNITY): Payer: Self-pay | Admitting: Obstetrics & Gynecology

## 2023-07-16 DIAGNOSIS — O99344 Other mental disorders complicating childbirth: Secondary | ICD-10-CM

## 2023-07-16 DIAGNOSIS — O1002 Pre-existing essential hypertension complicating childbirth: Secondary | ICD-10-CM

## 2023-07-16 DIAGNOSIS — Z3A37 37 weeks gestation of pregnancy: Secondary | ICD-10-CM

## 2023-07-16 DIAGNOSIS — O24424 Gestational diabetes mellitus in childbirth, insulin controlled: Secondary | ICD-10-CM

## 2023-07-16 LAB — GLUCOSE, CAPILLARY: Glucose-Capillary: 99 mg/dL (ref 70–99)

## 2023-07-16 LAB — PROTEIN / CREATININE RATIO, URINE
Creatinine, Urine: 170 mg/dL
Protein Creatinine Ratio: 0.15 mg/mg{creat} (ref 0.00–0.15)
Total Protein, Urine: 26 mg/dL

## 2023-07-16 MED ORDER — COCONUT OIL OIL: 1.0000 | TOPICAL_OIL | Status: DC | PRN

## 2023-07-16 MED ORDER — SENNOSIDES-DOCUSATE SODIUM 8.6-50 MG PO TABS
2.0000 | ORAL_TABLET | Freq: Every day | ORAL | Status: DC
  Administered 2023-07-17 – 2023-07-18 (×2): 2 via ORAL
  Filled 2023-07-16 (×2): qty 2

## 2023-07-16 MED ORDER — TRANEXAMIC ACID-NACL 1000-0.7 MG/100ML-% IV SOLN
INTRAVENOUS | Status: AC
  Filled 2023-07-16: qty 100

## 2023-07-16 MED ORDER — PRENATAL MULTIVITAMIN CH
1.0000 | ORAL_TABLET | Freq: Every day | ORAL | Status: DC
  Administered 2023-07-16 – 2023-07-18 (×3): 1 via ORAL
  Filled 2023-07-16 (×3): qty 1

## 2023-07-16 MED ORDER — ONDANSETRON HCL 4 MG/2ML IJ SOLN: 4.0000 mg | INTRAMUSCULAR | Status: DC | PRN

## 2023-07-16 MED ORDER — ACETAMINOPHEN 325 MG PO TABS: 650.0000 mg | ORAL_TABLET | ORAL | Status: DC | PRN

## 2023-07-16 MED ORDER — SERTRALINE HCL 50 MG PO TABS
100.0000 mg | ORAL_TABLET | Freq: Every day | ORAL | Status: DC
  Administered 2023-07-16 – 2023-07-18 (×3): 100 mg via ORAL
  Filled 2023-07-16 (×3): qty 2

## 2023-07-16 MED ORDER — WITCH HAZEL-GLYCERIN EX PADS: 1.0000 | MEDICATED_PAD | CUTANEOUS | Status: DC | PRN

## 2023-07-16 MED ORDER — DIBUCAINE (PERIANAL) 1 % EX OINT: 1.0000 | TOPICAL_OINTMENT | CUTANEOUS | Status: DC | PRN

## 2023-07-16 MED ORDER — OXYCODONE HCL 5 MG PO TABS: 5.0000 mg | ORAL_TABLET | ORAL | Status: DC | PRN

## 2023-07-16 MED ORDER — DIPHENHYDRAMINE HCL 25 MG PO CAPS: 25.0000 mg | ORAL_CAPSULE | Freq: Four times a day (QID) | ORAL | Status: DC | PRN

## 2023-07-16 MED ORDER — BENZOCAINE-MENTHOL 20-0.5 % EX AERO
1.0000 | INHALATION_SPRAY | CUTANEOUS | Status: DC | PRN
  Administered 2023-07-17: 1 via TOPICAL
  Filled 2023-07-16: qty 56

## 2023-07-16 MED ORDER — IBUPROFEN 600 MG PO TABS
600.0000 mg | ORAL_TABLET | Freq: Four times a day (QID) | ORAL | Status: DC
  Administered 2023-07-16 – 2023-07-18 (×10): 600 mg via ORAL
  Filled 2023-07-16 (×10): qty 1

## 2023-07-16 MED ORDER — SIMETHICONE 80 MG PO CHEW: 80.0000 mg | CHEWABLE_TABLET | ORAL | Status: DC | PRN

## 2023-07-16 MED ORDER — MAGNESIUM HYDROXIDE 400 MG/5ML PO SUSP: 30.0000 mL | ORAL | Status: DC | PRN

## 2023-07-16 MED ORDER — ONDANSETRON HCL 4 MG PO TABS: 4.0000 mg | ORAL_TABLET | ORAL | Status: DC | PRN

## 2023-07-16 MED ORDER — ZOLPIDEM TARTRATE 5 MG PO TABS: 5.0000 mg | ORAL_TABLET | Freq: Every evening | ORAL | Status: DC | PRN

## 2023-07-16 MED ORDER — FUROSEMIDE 20 MG PO TABS
20.0000 mg | ORAL_TABLET | Freq: Every day | ORAL | Status: DC
  Administered 2023-07-16 – 2023-07-18 (×3): 20 mg via ORAL
  Filled 2023-07-16 (×3): qty 1

## 2023-07-16 NOTE — Anesthesia Postprocedure Evaluation (Signed)
Anesthesia Post Note  Patient: Tina Dixon  Procedure(s) Performed: AN AD HOC LABOR EPIDURAL     Patient location during evaluation: Specials Recovery Anesthesia Type: Epidural Level of consciousness: awake and alert Pain management: pain level controlled Vital Signs Assessment: post-procedure vital signs reviewed and stable Respiratory status: spontaneous breathing, nonlabored ventilation and respiratory function stable Cardiovascular status: stable Postop Assessment: no headache, no backache and epidural receding Anesthetic complications: no   No notable events documented.  Last Vitals:  Vitals:   07/16/23 0758 07/16/23 1228  BP: 118/66 128/84  Pulse: 95 (!) 101  Resp: 17 19  Temp: 36.6 C   SpO2: 98% 96%    Last Pain:  Vitals:   07/16/23 1228  TempSrc: Oral  PainSc:    Pain Goal:                   Wyat Infinger

## 2023-07-16 NOTE — Plan of Care (Signed)
  Problem: Education: Goal: Knowledge of General Education information will improve Description: Including pain rating scale, medication(s)/side effects and non-pharmacologic comfort measures Outcome: Completed/Met   Problem: Health Behavior/Discharge Planning: Goal: Ability to manage health-related needs will improve Outcome: Completed/Met   Problem: Clinical Measurements: Goal: Ability to maintain clinical measurements within normal limits will improve Outcome: Completed/Met Goal: Will remain free from infection Outcome: Completed/Met Goal: Diagnostic test results will improve Outcome: Completed/Met Goal: Respiratory complications will improve Outcome: Completed/Met Goal: Cardiovascular complication will be avoided Outcome: Completed/Met   Problem: Activity: Goal: Risk for activity intolerance will decrease Outcome: Completed/Met   Problem: Nutrition: Goal: Adequate nutrition will be maintained Outcome: Completed/Met   Problem: Coping: Goal: Level of anxiety will decrease Outcome: Completed/Met   Problem: Elimination: Goal: Will not experience complications related to bowel motility Outcome: Completed/Met Goal: Will not experience complications related to urinary retention Outcome: Completed/Met   Problem: Pain Managment: Goal: General experience of comfort will improve Outcome: Completed/Met   Problem: Safety: Goal: Ability to remain free from injury will improve Outcome: Completed/Met   Problem: Skin Integrity: Goal: Risk for impaired skin integrity will decrease Outcome: Completed/Met   Problem: Education: Goal: Knowledge of Childbirth will improve Outcome: Completed/Met Goal: Ability to make informed decisions regarding treatment and plan of care will improve Outcome: Completed/Met Goal: Ability to state and carry out methods to decrease the pain will improve Outcome: Completed/Met Goal: Individualized Educational Video(s) Outcome: Completed/Met    Problem: Coping: Goal: Ability to verbalize concerns and feelings about labor and delivery will improve Outcome: Completed/Met   Problem: Life Cycle: Goal: Ability to make normal progression through stages of labor will improve Outcome: Completed/Met Goal: Ability to effectively push during vaginal delivery will improve Outcome: Completed/Met   Problem: Role Relationship: Goal: Will demonstrate positive interactions with the child Outcome: Completed/Met   Problem: Safety: Goal: Risk of complications during labor and delivery will decrease Outcome: Completed/Met   Problem: Pain Management: Goal: Relief or control of pain from uterine contractions will improve Outcome: Completed/Met   Problem: Education: Goal: Knowledge of condition will improve Outcome: Completed/Met Goal: Individualized Educational Video(s) Outcome: Completed/Met Goal: Individualized Newborn Educational Video(s) Outcome: Completed/Met   Problem: Activity: Goal: Will verbalize the importance of balancing activity with adequate rest periods Outcome: Completed/Met Goal: Ability to tolerate increased activity will improve Outcome: Completed/Met   Problem: Coping: Goal: Ability to identify and utilize available resources and services will improve Outcome: Completed/Met   Problem: Life Cycle: Goal: Chance of risk for complications during the postpartum period will decrease Outcome: Completed/Met   Problem: Role Relationship: Goal: Ability to demonstrate positive interaction with newborn will improve Outcome: Completed/Met   Problem: Skin Integrity: Goal: Demonstration of wound healing without infection will improve Outcome: Completed/Met

## 2023-07-16 NOTE — Discharge Summary (Signed)
Postpartum Discharge Summary  Date of Service updated***     Patient Name: Tina Dixon DOB: 11-Mar-1991 MRN: [id number]  Date of admission: 07/15/2023 Delivery date:07/16/2023 Delivering provider: CHUBB, CASEY C Date of discharge: 07/16/2023  Admitting diagnosis: Insulin controlled gestational diabetes mellitus in third trimester [O24.414] Intrauterine pregnancy: [redacted]w[redacted]d     Secondary diagnosis:  Principal Problem:   Insulin controlled gestational diabetes mellitus in third trimester Active Problems:   Chronic hypertension during pregnancy   SVD (spontaneous vaginal delivery)  Additional problems: Anxiety/depression on zoloft    Discharge diagnosis: Term Pregnancy Delivered                                              Post partum procedures:{Postpartum procedures:23558} Augmentation: AROM and Pitocin Complications: None  Hospital course: Induction of Labor With Vaginal Delivery   32 y.o. yo G2P1001 at [redacted]w[redacted]d was admitted to the hospital 07/15/2023 for induction of labor.  Indication for induction: A2 DM and cHTN .  Patient had an uncomplicated labor course. Membrane Rupture Time/Date: 10:58 PM,07/15/2023  Delivery Method:Vaginal, Spontaneous Operative Delivery:N/A Episiotomy: None Lacerations:  2nd degree Details of delivery can be found in separate delivery note.  Patient had a postpartum course complicated by***. Patient is discharged home 07/16/23.  Newborn Data: Birth date:07/16/2023 Birth time:4:50 AM Gender:Female Living status:Living Apgars:7 ,9  Weight:   Magnesium Sulfate received: {Mag received:30440022} BMZ received: No Rhophylac:N/A MMR:N/A T-DaP:Given prenatally Flu: N/A Transfusion:{Transfusion received:30440034}  Physical exam  Vitals:   07/16/23 0301 07/16/23 0314 07/16/23 0332 07/16/23 0443  BP: 118/79  137/81   Pulse: (!) 104  (!) 110   Resp: 18  17   Temp:  98.4 F (36.9 C)  99.2 F (37.3 C)  TempSrc:  Oral  Axillary  SpO2:      Weight:       Height:       General: {Exam; general:21111117} Lochia: {Desc; appropriate/inappropriate:30686::"appropriate"} Uterine Fundus: {Desc; firm/soft:30687} Incision: {Exam; incision:21111123} DVT Evaluation: {Exam; dvt:2111122} Labs: Lab Results  Component Value Date   WBC 15.1 (H) 07/15/2023   HGB 11.8 (L) 07/15/2023   HCT 36.8 07/15/2023   MCV 87.0 07/15/2023   PLT 328 07/15/2023      Latest Ref Rng & Units 07/15/2023   10:25 AM  CMP  Glucose 70 - 99 mg/dL 578   BUN 6 - 20 mg/dL 5   Creatinine 4.69 - 6.29 mg/dL 5.28   Sodium 413 - 244 mmol/L 132   Potassium 3.5 - 5.1 mmol/L 3.7   Chloride 98 - 111 mmol/L 102   CO2 22 - 32 mmol/L 16   Calcium 8.9 - 10.3 mg/dL 8.2   Total Protein 6.5 - 8.1 g/dL 6.5   Total Bilirubin 0.3 - 1.2 mg/dL 0.5   Alkaline Phos 38 - 126 U/L 143   AST 15 - 41 U/L 15   ALT 0 - 44 U/L 16    Edinburgh Score:    05/31/2022    9:02 AM  Edinburgh Postnatal Depression Scale Screening Tool  I have been able to laugh and see the funny side of things. 0  I have looked forward with enjoyment to things. 0  I have blamed myself unnecessarily when things went wrong. 2  I have been anxious or worried for no good reason. 2  I have felt scared or panicky for no good reason.  0  Things have been getting on top of me. 1  I have been so unhappy that I have had difficulty sleeping. 0  I have felt sad or miserable. 2  I have been so unhappy that I have been crying. 0  The thought of harming myself has occurred to me. 0  Edinburgh Postnatal Depression Scale Total 7     After visit meds:  Allergies as of 07/16/2023   No Known Allergies   Med Rec must be completed prior to using this [facility]***        Discharge home in stable condition Infant Feeding: {Baby feeding:23562} Infant Disposition:{CHL IP OB HOME WITH WRUEAV:40981} Discharge instruction: per After Visit Summary and Postpartum booklet. Activity: Advance as tolerated. Pelvic rest for 6 weeks.   Diet: {OB XBJY:78295621} Future Appointments:No future appointments. Follow up Visit:  Message sent by [facility] 9/2  Please schedule this patient for a In person postpartum visit in 6 weeks with the following provider: Any provider. Additional Postpartum F/U:2 hour GTT and BP check 1 week  High risk pregnancy complicated by: GDM and HTN Delivery mode:  Vaginal, Spontaneous Anticipated Birth Control:  Nexplanon - to be placed outpatient   07/16/2023 Lennart Pall, MD

## 2023-07-16 NOTE — Lactation Note (Signed)
This note was copied from a baby's chart. Lactation Consultation Note  Patient Name: Tina Dixon [id number] Date: 07/16/2023 Age:31 hours   LC asked RN to inquire about patient needing lactation consultation.  Mom stated she plans to formula feeding and LC services not needed.  Consult Status Consult Status: Complete    Judee Clara 07/16/2023, 11:07 AM

## 2023-07-17 LAB — CBC
HCT: 31.2 % — ABNORMAL LOW (ref 36.0–46.0)
Hemoglobin: 10.1 g/dL — ABNORMAL LOW (ref 12.0–15.0)
MCH: 28.1 pg (ref 26.0–34.0)
MCHC: 32.4 g/dL (ref 30.0–36.0)
MCV: 86.9 fL (ref 80.0–100.0)
Platelets: 250 10*3/uL (ref 150–400)
RBC: 3.59 MIL/uL — ABNORMAL LOW (ref 3.87–5.11)
RDW: 13.3 % (ref 11.5–15.5)
WBC: 10.4 10*3/uL (ref 4.0–10.5)
nRBC: 0 % (ref 0.0–0.2)

## 2023-07-17 NOTE — Progress Notes (Signed)
Post Partum Day 1 Subjective: Patient seen in the NICU. She reports feeling well. She is ambulating, voiding and tolerating a regular diet.  Objective: Blood pressure 122/83, pulse 77, temperature (!) 97.5 F (36.4 C), temperature source Oral, resp. rate 18, height 5\' 3"  (1.6 m), weight 112.4 kg, last menstrual period 10/29/2022, SpO2 98%, unknown if currently breastfeeding.  Physical Exam:  General: alert, cooperative, and no distress Lochia: appropriate Uterine Fundus: firm DVT Evaluation: No evidence of DVT seen on physical exam.  Recent Labs    07/15/23 2020 07/17/23 0551  HGB 11.8* 10.1*  HCT 36.8 31.2*    Assessment/Plan: Patient PPD#1 s/p SVD - Patient is doing well - Continue current postpartum care - Plan for discharge home tomorrow   LOS: 2 days   Catalina Antigua, MD 07/17/2023, 4:12 PM

## 2023-07-17 NOTE — Social Work (Signed)
CSW received consult for [place] 11. CSW met with Tina Dixon to offer support and complete assessment.    CSW met with Tina Dixon at bedside and introduced CSW role. CSW observed Tina Dixon sitting up in bed and maternal grandmother at bedside. Tina Dixon presented pleasant and was receptive to the visit. CSW offered privacy. Tina Dixon gave CSW permission to share all information with maternal grandmother present. CSW inquired how Tina Dixon has been doing since giving birth. Tina Dixon shared that she has been feeling good since learning that baby "Mardelle Matte" is doing well. Tina Dixon expressed that she cried a bit this morning because she was worried about the baby's medical update. CSW validated Tina Dixon concerns. Tina Dixon shared for the past 7 days she has been anxious about the delivery and felt physically uncomfortable. She stated feeling a sense of relief emotionally and physically since giving birth. Tina Dixon shared in 2017, she was diagnosed with depression and anxiety. She currently takes Zoloft 100mg  for her symptoms and feels the medication is helpful. She will continue to take the medication postpartum. She stated that she has been seeing a counselor on and off for years but is now looking for a Conservator, museum/gallery. CSW provided education regarding the baby blues period vs. perinatal mood disorders, discussed treatment and gave resources for mental health follow up if concerns arise.  Tina Dixon shared that she did not experience PPD with her daughter however she is knowledgeable of the symptoms after helping a relative who had PPD. CSW recommended Tina Dixon complete a self-evaluation during the postpartum time period using the New Mom Checklist from Postpartum Progress and encouraged Tina Dixon to contact a medical professional if symptoms are noted at any time. Tina Dixon was appreciative of the resources. CSW assessed Tina Dixon for safety. Tina Dixon denied SI/HI/DV. Tina Dixon identified her maternal grandmother and her husband as supports.   CSW provided review of Sudden Infant Death Syndrome (SIDS) precautions.  Tina Dixon was very knowledgeable of SIDS. Tina Dixon reported she has all items to care for the infant including a crib and car seat. Tina Dixon has chosen [employer] for the infant's follow up care.    CSW identifies no further need for intervention and no barriers to discharge at this time.  Vivi Barrack, MSW, LCSW [facility]  Clinical Social Worker  May 29, 2023  3:46 PM

## 2023-07-17 NOTE — Progress Notes (Signed)
Patient screened out for psychosocial assessment since none of the following apply:  Psychosocial stressors documented in mother or baby's chart  Gestation less than 32 weeks  Code at delivery   Infant with anomalies Please contact the Clinical Social Worker if specific needs arise, by MOB's request, or if MOB scores greater than 9/yes to question 10 on [place] Postpartum Depression Screen.  Kimberly Long, LCSW Clinical Social Worker [facility] Cell#: [phone]     

## 2023-07-18 ENCOUNTER — Other Ambulatory Visit: Payer: BC Managed Care – PPO

## 2023-07-18 ENCOUNTER — Other Ambulatory Visit (HOSPITAL_COMMUNITY): Payer: Self-pay

## 2023-07-18 ENCOUNTER — Encounter: Payer: BC Managed Care – PPO | Admitting: Family Medicine

## 2023-07-18 MED ORDER — FUROSEMIDE 20 MG PO TABS
20.0000 mg | ORAL_TABLET | Freq: Every day | ORAL | 0 refills | Status: DC
Start: 2023-07-19 — End: 2023-09-21
  Filled 2023-07-18: qty 3, 3d supply, fill #0

## 2023-07-18 MED ORDER — OXYCODONE-ACETAMINOPHEN 5-325 MG PO TABS
1.0000 | ORAL_TABLET | Freq: Four times a day (QID) | ORAL | 0 refills | Status: DC | PRN
Start: 2023-07-18 — End: 2023-09-21
  Filled 2023-07-18: qty 20, 5d supply, fill #0

## 2023-07-18 MED ORDER — IBUPROFEN 600 MG PO TABS
600.0000 mg | ORAL_TABLET | Freq: Four times a day (QID) | ORAL | 3 refills | Status: AC | PRN
  Filled 2023-07-18: qty 60, 15d supply, fill #0

## 2023-07-19 ENCOUNTER — Telehealth (HOSPITAL_COMMUNITY): Payer: Self-pay | Admitting: *Deleted

## 2023-07-19 DIAGNOSIS — Z1331 Encounter for screening for depression: Secondary | ICD-10-CM

## 2023-07-19 LAB — CERVICOVAGINAL ANCILLARY ONLY
Chlamydia: NEGATIVE
Comment: NEGATIVE
Comment: NORMAL
Neisseria Gonorrhea: NEGATIVE

## 2023-07-19 NOTE — Telephone Encounter (Signed)
EPDS score in the hospital was 11 (question ten was 0).  Placed order for IBH referral.  Dr. Alvester Morin notified via [facility] order.

## 2023-07-20 ENCOUNTER — Other Ambulatory Visit: Payer: Self-pay

## 2023-07-20 ENCOUNTER — Encounter (HOSPITAL_COMMUNITY): Payer: Self-pay | Admitting: Obstetrics & Gynecology

## 2023-07-20 ENCOUNTER — Inpatient Hospital Stay (HOSPITAL_COMMUNITY)
Admission: AD | Admit: 2023-07-20 | Discharge: 2023-07-20 | Disposition: A | Discharge status: Home / Self Care | Payer: BC Managed Care – PPO | Attending: Obstetrics & Gynecology | Admitting: Obstetrics & Gynecology

## 2023-07-20 ENCOUNTER — Encounter: Payer: Self-pay | Admitting: Obstetrics and Gynecology

## 2023-07-20 ENCOUNTER — Inpatient Hospital Stay (HOSPITAL_COMMUNITY): Payer: BC Managed Care – PPO

## 2023-07-20 DIAGNOSIS — Z3A37 37 weeks gestation of pregnancy: Secondary | ICD-10-CM

## 2023-07-20 DIAGNOSIS — O165 Unspecified maternal hypertension, complicating the puerperium: Secondary | ICD-10-CM | POA: Diagnosis not present

## 2023-07-20 DIAGNOSIS — F419 Anxiety disorder, unspecified: Secondary | ICD-10-CM | POA: Insufficient documentation

## 2023-07-20 DIAGNOSIS — O99893 Other specified diseases and conditions complicating puerperium: Secondary | ICD-10-CM | POA: Diagnosis not present

## 2023-07-20 DIAGNOSIS — R079 Chest pain, unspecified: Secondary | ICD-10-CM | POA: Diagnosis not present

## 2023-07-20 DIAGNOSIS — F418 Other specified anxiety disorders: Secondary | ICD-10-CM

## 2023-07-20 LAB — D-DIMER, QUANTITATIVE: D-Dimer, Quant: 2.4 ug{FEU}/mL — ABNORMAL HIGH (ref 0.00–0.50)

## 2023-07-20 LAB — COMPREHENSIVE METABOLIC PANEL WITH GFR
ALT: 21 U/L (ref 0–44)
AST: 19 U/L (ref 15–41)
Albumin: 2.4 g/dL — ABNORMAL LOW (ref 3.5–5.0)
Alkaline Phosphatase: 95 U/L (ref 38–126)
Anion gap: 8 (ref 5–15)
BUN: 7 mg/dL (ref 6–20)
CO2: 25 mmol/L (ref 22–32)
Calcium: 7.9 mg/dL — ABNORMAL LOW (ref 8.9–10.3)
Chloride: 107 mmol/L (ref 98–111)
Creatinine, Ser: 0.65 mg/dL (ref 0.44–1.00)
GFR, Estimated: >60 mL/min
Glucose, Bld: 95 mg/dL (ref 70–99)
Potassium: 3.7 mmol/L (ref 3.5–5.1)
Sodium: 140 mmol/L (ref 135–145)
Total Bilirubin: 0.2 mg/dL — ABNORMAL LOW (ref 0.3–1.2)
Total Protein: 5.7 g/dL — ABNORMAL LOW (ref 6.5–8.1)

## 2023-07-20 LAB — CBC
HCT: 34.5 % — ABNORMAL LOW (ref 36.0–46.0)
Hemoglobin: 10.9 g/dL — ABNORMAL LOW (ref 12.0–15.0)
MCH: 28 pg (ref 26.0–34.0)
MCHC: 31.6 g/dL (ref 30.0–36.0)
MCV: 88.7 fL (ref 80.0–100.0)
Platelets: 335 10*3/uL (ref 150–400)
RBC: 3.89 MIL/uL (ref 3.87–5.11)
RDW: 13.9 % (ref 11.5–15.5)
WBC: 8.8 10*3/uL (ref 4.0–10.5)
nRBC: 0 % (ref 0.0–0.2)

## 2023-07-20 LAB — TROPONIN I (HIGH SENSITIVITY): Troponin I (High Sensitivity): 6 ng/L (ref <18)

## 2023-07-20 LAB — BRAIN NATRIURETIC PEPTIDE: B Natriuretic Peptide: 166.5 pg/mL — ABNORMAL HIGH (ref 0.0–100.0)

## 2023-07-20 MED ORDER — NIFEDIPINE ER OSMOTIC RELEASE 30 MG PO TB24: 30.0000 mg | ORAL_TABLET | Freq: Every day | ORAL | 0 refills | Status: DC

## 2023-07-20 MED ORDER — NIFEDIPINE 10 MG PO CAPS
10.0000 mg | ORAL_CAPSULE | Freq: Once | ORAL | Status: AC
  Administered 2023-07-20: 10 mg via ORAL
  Filled 2023-07-20: qty 1

## 2023-07-20 MED ORDER — EXCEDRIN TENSION HEADACHE 500-65 MG PO TABS: 2.0000 | ORAL_TABLET | Freq: Four times a day (QID) | ORAL | 0 refills | Status: AC | PRN

## 2023-07-20 MED ORDER — ACETAMINOPHEN-CAFFEINE 500-65 MG PO TABS
2.0000 | ORAL_TABLET | Freq: Once | ORAL | Status: AC
  Administered 2023-07-20: 2 via ORAL
  Filled 2023-07-20: qty 2

## 2023-07-20 NOTE — MAU Note (Signed)
Tina Dixon is a 32 y.o. at [redacted]w[redacted]d here in MAU reporting: has took BP @ home last night that was elevated, took it again today and BP remains elevated, was 167/80 when last taken.  States has current HA and has continued since Wednesday.  Has been taking Ibuprofen without relief. Reports current visual disturbances, denies epigastric pain. S/P NVD 07/16/2023 LMP: NA Onset of complaint: last night Pain score: 8 Vitals:   07/20/23 1610  BP: (!) 151/88  Pulse: 92  Resp: 18  Temp: 97.8 F (36.6 C)  SpO2: 99%     FHT:NA Lab orders placed from triage:   NA

## 2023-07-20 NOTE — MAU Provider Note (Signed)
Chief Complaint:  BP Evaluation  HPI   Event Date/Time   First Provider Initiated Contact with Patient 07/20/23 1709     Tina Dixon is a 32 y.o. G2P2002 on PPD#4 who presents to maternity admissions reporting elevated blood pressure with a headache (8/10) unresponsive to ibuprofen. Also having some light sensitivity but no epigastric pain. Her newborn son is in the NICU and she is feeling very stressed about his stay. Had an episode last night of chest pain, but is unsure if it is anxiety. She woke up and her arm was asleep, then she felt mid-sternal chest pain with tingling in her arm. Has since resolved without intervention. Has a history of gestational hypertension, was discharged on lasix but no BP meds. No other physical complaints.  Pregnancy Course: Received PNC at [facility]. Prenatal records reviewed. Has a history of chest pain with anxiety once before, several years ago.  Past Medical History:  Diagnosis Date   Anxiety and depression 07/25/2022   Elevated triglycerides with high cholesterol 07/25/2022   Gestational diabetes    Gestational diabetes mellitus, antepartum 02/08/2022   Pregnancy induced hypertension    OB History  Gravida Para Term Preterm AB Living  2 2 2     2   SAB IAB Ectopic Multiple Live Births        0 2    # Outcome Date GA Lbr Len/2nd Weight Sex Type Anes PTL Lv  2 Term 07/16/23 [redacted]w[redacted]d 16:56 / 01:05 6 lb 7.4 oz (2.93 kg) M Vag-Spont EPI  LIV     Birth Comments: NICU  1 Term 04/07/22 [redacted]w[redacted]d / 01:30 5 lb 5.7 oz (2.43 kg) F Vag-Spont EPI  LIV     Birth Comments: wnl   Past Surgical History:  Procedure Laterality Date   WISDOM TOOTH EXTRACTION     Family History  Problem Relation Age of Onset   Hypertension Mother    Pancreatic cancer Father        pancreatic, passed at 86   Pancreatitis Father    Diabetes Father    Diabetes Maternal Grandmother    Hypertension Maternal Grandmother    Hypertension Maternal Grandfather    Heart disease Paternal  Grandmother    Pancreatitis Paternal Grandfather    Social History   Tobacco Use   Smoking status: Never   Smokeless tobacco: Never  Vaping Use   Vaping status: Never Used  Substance Use Topics   Alcohol use: Not Currently    Comment: socially   Drug use: No   No Known Allergies No medications prior to admission.   I have reviewed patient's Past Medical Hx, Surgical Hx, Family Hx, Social Hx, medications and allergies.   ROS  Pertinent items noted in HPI and remainder of comprehensive ROS otherwise negative.   PHYSICAL EXAM  Patient Vitals for the past 24 hrs:  BP Temp Temp src Pulse Resp SpO2 Height Weight  07/20/23 1816 (!) 136/91 -- -- 72 -- -- -- --  07/20/23 1815 -- -- -- -- -- 98 % -- --  07/20/23 1810 -- -- -- -- -- 99 % -- --  07/20/23 1805 -- -- -- -- -- 99 % -- --  07/20/23 1801 (!) 154/91 -- -- 76 -- -- -- --  07/20/23 1800 -- -- -- -- -- 98 % -- --  07/20/23 1755 -- -- -- -- -- 97 % -- --  07/20/23 1750 -- -- -- -- -- 97 % -- --  07/20/23 1746 (!) 145/88 -- --  79 -- -- -- --  07/20/23 1745 -- -- -- -- -- 98 % -- --  07/20/23 1740 -- -- -- -- -- 98 % -- --  07/20/23 1735 -- -- -- -- -- 97 % -- --  07/20/23 1731 (!) 141/82 -- -- 72 -- -- -- --  07/20/23 1730 -- -- -- -- -- 97 % -- --  07/20/23 1725 -- -- -- -- -- 97 % -- --  07/20/23 1720 -- -- -- -- -- 96 % -- --  07/20/23 1716 133/83 -- -- 73 -- -- -- --  07/20/23 1715 -- -- -- -- -- 97 % -- --  07/20/23 1710 -- -- -- -- -- 97 % -- --  07/20/23 1705 -- -- -- -- -- 98 % -- --  07/20/23 1701 (!) 142/86 -- -- 86 -- -- -- --  07/20/23 1700 -- -- -- -- -- 98 % -- --  07/20/23 1657 (!) 142/87 -- -- 86 -- -- -- --  07/20/23 1646 127/82 -- -- 77 -- -- -- --  07/20/23 1645 -- -- -- -- -- 96 % -- --  07/20/23 1640 -- -- -- -- -- 97 % -- --  07/20/23 1635 -- -- -- -- -- 98 % -- --  07/20/23 1631 138/80 -- -- 80 -- -- -- --  07/20/23 1630 -- -- -- -- -- 98 % -- --  07/20/23 1625 -- -- -- -- -- 98 % -- --   07/20/23 1621 (!) 145/88 -- -- 81 -- -- -- --  07/20/23 1610 (!) 151/88 97.8 F (36.6 C) Oral 92 18 99 % -- --  07/20/23 1604 -- -- -- -- -- -- 5\' 3"  (1.6 m) 242 lb 3.2 oz (109.9 kg)   Constitutional: Well-developed, well-nourished female in no acute distress.  Cardiovascular: normal rate & rhythm, warm and well-perfused Respiratory: normal effort, no problems with respiration noted GI: Abd soft, non-tender, non-distended MS: Extremities nontender, no edema, normal ROM Neurologic: Alert and oriented x 4.  GU: no CVA tenderness Pelvic: exam deferred   Labs: Results for orders placed or performed during the hospital encounter of 07/20/23 (from the past 24 hour(s))  Comprehensive metabolic panel     Status: Abnormal   Collection Time: 07/20/23  4:37 PM  Result Value Ref Range   Sodium 140 135 - 145 mmol/L   Potassium 3.7 3.5 - 5.1 mmol/L   Chloride 107 98 - 111 mmol/L   CO2 25 22 - 32 mmol/L   Glucose, Bld 95 70 - 99 mg/dL   BUN 7 6 - 20 mg/dL   Creatinine, Ser 1.61 0.44 - 1.00 mg/dL   Calcium 7.9 (L) 8.9 - 10.3 mg/dL   Total Protein 5.7 (L) 6.5 - 8.1 g/dL   Albumin 2.4 (L) 3.5 - 5.0 g/dL   AST 19 15 - 41 U/L   ALT 21 0 - 44 U/L   Alkaline Phosphatase 95 38 - 126 U/L   Total Bilirubin 0.2 (L) 0.3 - 1.2 mg/dL   GFR, Estimated >09 >60 mL/min   Anion gap 8 5 - 15  CBC     Status: Abnormal   Collection Time: 07/20/23  4:37 PM  Result Value Ref Range   WBC 8.8 4.0 - 10.5 K/uL   RBC 3.89 3.87 - 5.11 MIL/uL   Hemoglobin 10.9 (L) 12.0 - 15.0 g/dL   HCT 45.4 (L) 09.8 - 11.9 %   MCV 88.7 80.0 - 100.0 fL   MCH  28.0 26.0 - 34.0 pg   MCHC 31.6 30.0 - 36.0 g/dL   RDW 57.8 46.9 - 62.9 %   Platelets 335 150 - 400 K/uL   nRBC 0.0 0.0 - 0.2 %  Brain natriuretic peptide     Status: Abnormal   Collection Time: 07/20/23  4:37 PM  Result Value Ref Range   B Natriuretic Peptide 166.5 (H) 0.0 - 100.0 pg/mL  Troponin I (High Sensitivity)     Status: None   Collection Time: 07/20/23  4:37  PM  Result Value Ref Range   Troponin I (High Sensitivity) 6 <18 ng/L  D-dimer, quantitative     Status: Abnormal   Collection Time: 07/20/23  4:37 PM  Result Value Ref Range   D-Dimer, Quant 2.40 (H) 0.00 - 0.50 ug/mL-FEU   Imaging:  DG CHEST PORT 1 VIEW  Result Date: 07/20/2023 CLINICAL DATA:  Chest pain, recently pregnant EXAM: PORTABLE CHEST 1 VIEW COMPARISON:  None Available. FINDINGS: Cardiac and mediastinal contours are within normal limits. No focal pulmonary opacity. No pleural effusion or pneumothorax. No acute osseous abnormality. IMPRESSION: No acute cardiopulmonary process. Electronically Signed   By: Wiliam Ke M.D.   On: 07/20/2023 19:05    MDM & MAU COURSE  MDM: High  MAU Course: Orders Placed This Encounter  Procedures   DG CHEST PORT 1 VIEW   Comprehensive metabolic panel   CBC   Brain natriuretic peptide   D-dimer, quantitative   EKG 12-Lead   Discharge patient   Meds ordered this encounter  Medications   acetaminophen-caffeine (EXCEDRIN TENSION HEADACHE) 500-65 MG per tablet 2 tablet   NIFEdipine (PROCARDIA) capsule 10 mg   NIFEdipine (PROCARDIA-XL/NIFEDICAL-XL) 30 MG 24 hr tablet    Sig: Take 1 tablet (30 mg total) by mouth daily.    Dispense:  30 tablet    Refill:  0   acetaminophen-caffeine (EXCEDRIN TENSION HEADACHE) 500-65 MG TABS per tablet    Sig: Take 2 tablets by mouth 4 (four) times daily as needed.    Dispense:  60 tablet    Refill:  0   Workup ordered to r/o recent cardiac event or PEC. Excedrin-Tension given for her headache which completely relieved the pain. All labs normal, BNP mildly elevated but within normal for immediate postpartum. 10mg  immediate release nifedipine given for BP, then discharged on procardia 30mg  XL. Has follow up on Monday at [facility].  ASSESSMENT   1. Postpartum hypertension   2. Situational anxiety    PLAN  Discharge home in stable condition with return precautions     Follow-up Information     [facility] for [facility] Follow up.   Specialty: Obstetrics and Gynecology Why: as scheduled for postpartum follow up Contact information: [address] [place] [address] [phone]                Allergies as of 07/20/2023   No Known Allergies      Medication List     TAKE these medications    Excedrin Tension Headache 500-65 MG Tabs per tablet Generic drug: acetaminophen-caffeine Take 2 tablets by mouth 4 (four) times daily as needed.   furosemide 20 MG tablet Commonly known as: LASIX Take 1 tablet (20 mg total) by mouth daily.   ibuprofen 600 MG tablet Commonly known as: ADVIL Take 1 tablet (600 mg total) by mouth every 6 (six) hours as needed.   NIFEdipine 30 MG 24 hr tablet Commonly known  as: PROCARDIA-XL/NIFEDICAL-XL Take 1 tablet (30 mg total) by mouth daily.   oxyCODONE-acetaminophen 5-325 MG tablet Commonly known as: PERCOCET/ROXICET Take 1 tablet by mouth every 6 (six) hours as needed.   sertraline 100 MG tablet Commonly known as: ZOLOFT Take 1 tablet (100 mg total) by mouth daily.       Edd Arbour, CNM, MSN, IBCLC Certified Nurse Midwife, [facility]

## 2023-07-23 ENCOUNTER — Other Ambulatory Visit: Payer: BC Managed Care – PPO

## 2023-07-23 ENCOUNTER — Ambulatory Visit: Payer: BC Managed Care – PPO

## 2023-07-23 ENCOUNTER — Ambulatory Visit (INDEPENDENT_AMBULATORY_CARE_PROVIDER_SITE_OTHER): Payer: BC Managed Care – PPO

## 2023-07-23 VITALS — BP 135/88 | HR 77

## 2023-07-23 DIAGNOSIS — Z0131 Encounter for examination of blood pressure with abnormal findings: Secondary | ICD-10-CM

## 2023-07-23 DIAGNOSIS — O165 Unspecified maternal hypertension, complicating the puerperium: Secondary | ICD-10-CM

## 2023-07-23 NOTE — Progress Notes (Signed)
Subjective:  Tina Dixon is a 32 y.o. female here for BP check.   Hypertension ROS: Patient denies, visual symptoms, RUQ/epigastric pain or other concerning symptoms.Headaches are improving.   Objective:  LMP 10/29/2022   Appearance alert, well appearing, and in no distress. General exam BP noted to be 135/88 P: 77 today in office.    Assessment:   Blood Pressure stable and improved.   Plan:  Post Partum. Advised pt of precautions and to contact office if not feeling well or more elevated B/P Reading. Marland Kitchen

## 2023-07-25 ENCOUNTER — Other Ambulatory Visit: Payer: BC Managed Care – PPO

## 2023-07-25 ENCOUNTER — Encounter: Payer: BC Managed Care – PPO | Admitting: Obstetrics & Gynecology

## 2023-07-30 ENCOUNTER — Telehealth: Payer: Self-pay | Admitting: Clinical

## 2023-07-30 NOTE — Telephone Encounter (Signed)
Attempt call regarding referral; Left HIPPA-compliant message to call back [name] from [facility] at  [phone] ([name]'s office).    

## 2023-07-31 IMAGING — US US MFM UA CORD DOPPLER
1 series · 13 of 28 positions shown · non-contrast
Comparison: none

[Series 1: us mfm ua cord doppler · 45 acquisitions, 13 frames shown]
[im 2/45]
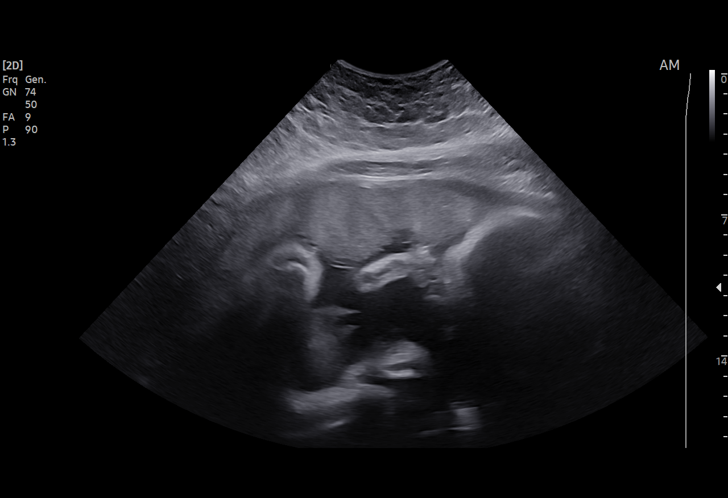
[im 5/45]
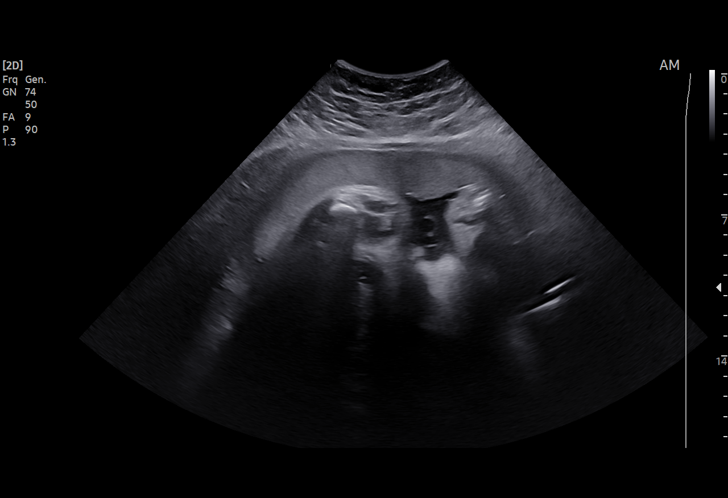
[im 9/45]
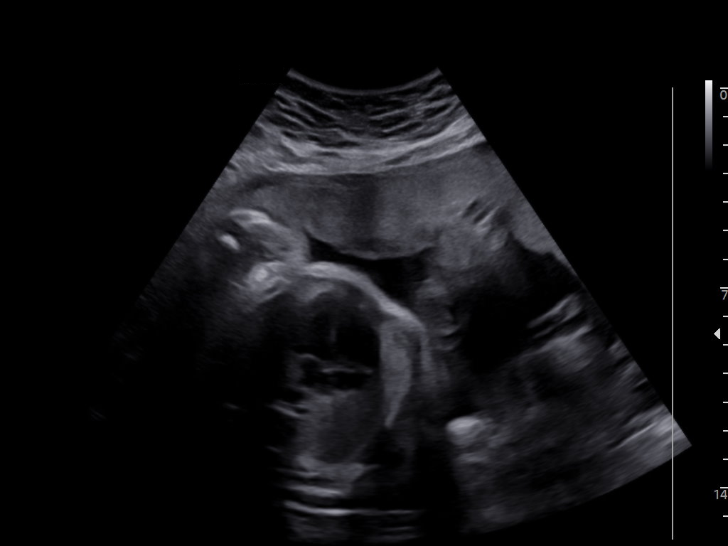
[im 12/45]
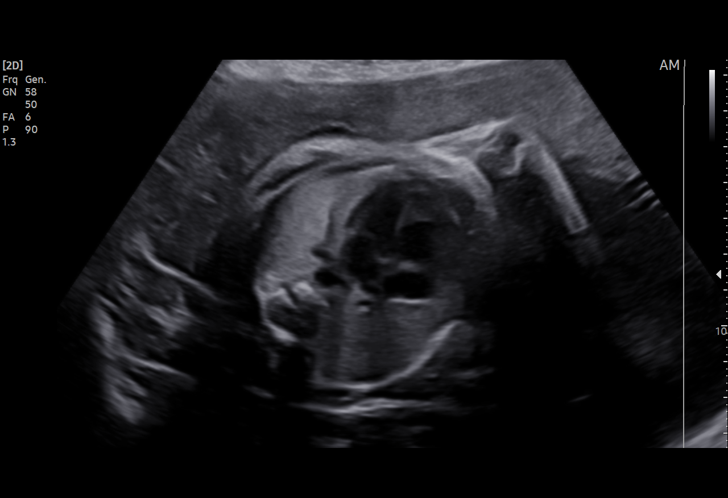
[im 15/45]
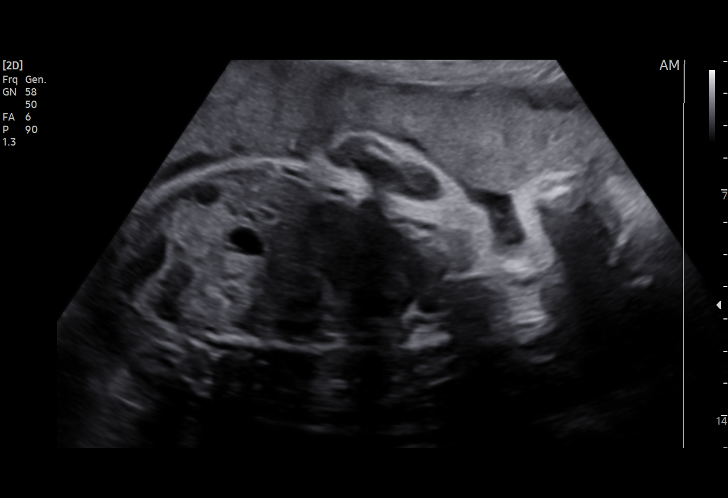
[im 18/45]
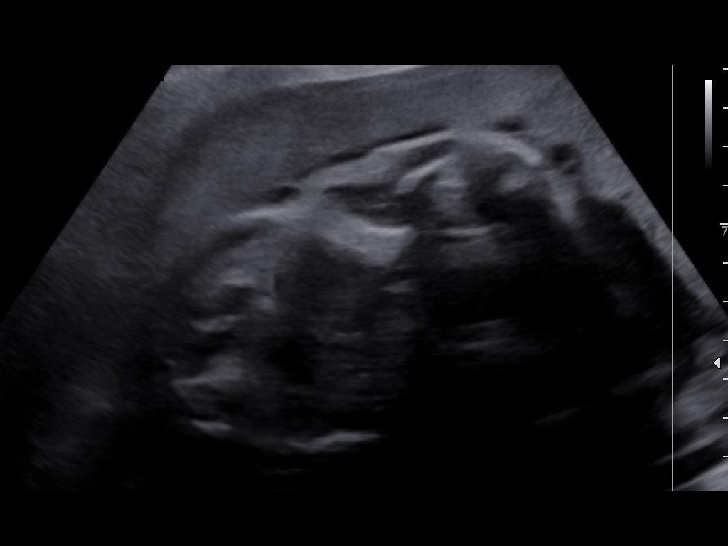
[im 23/45]
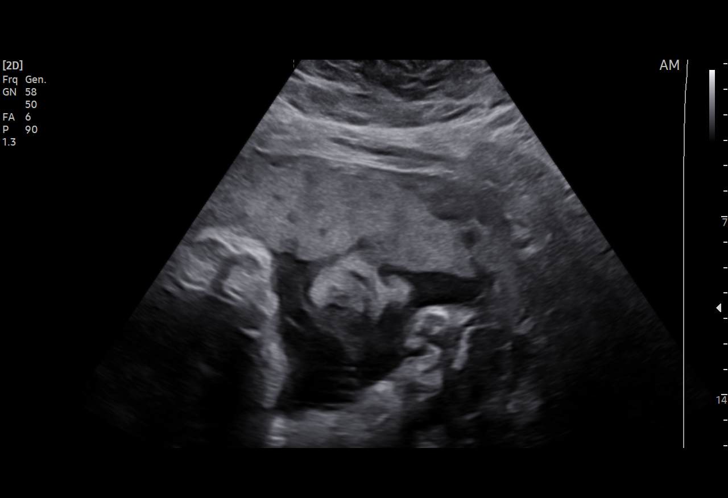
[im 27/45]
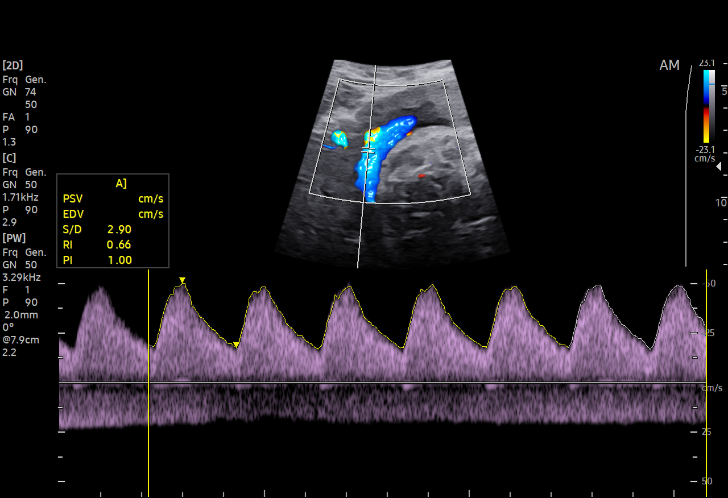
[im 30/45]
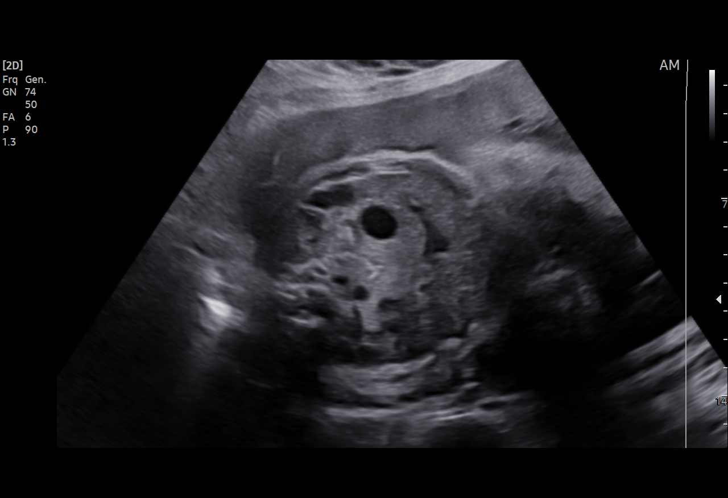
[im 33/45]
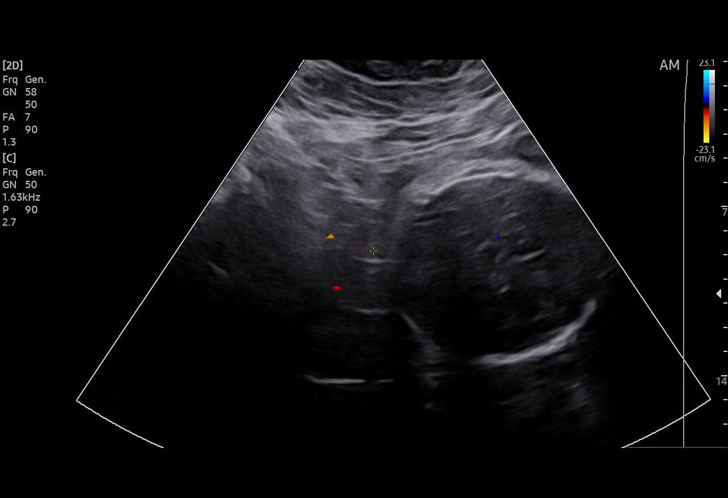
[im 36/45]
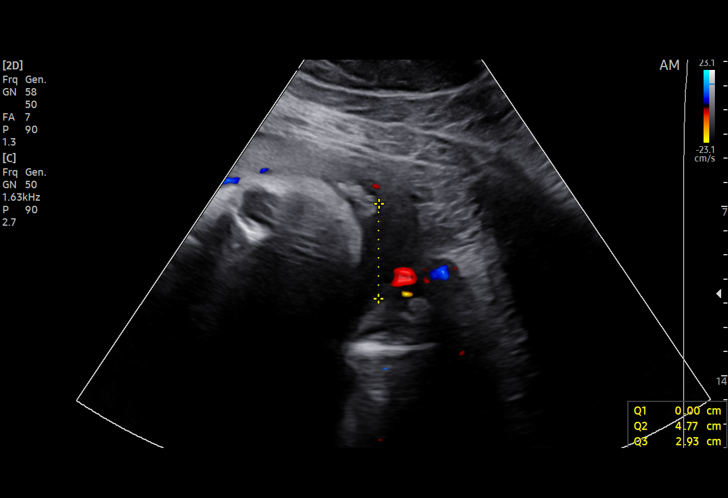
[im 40/45]
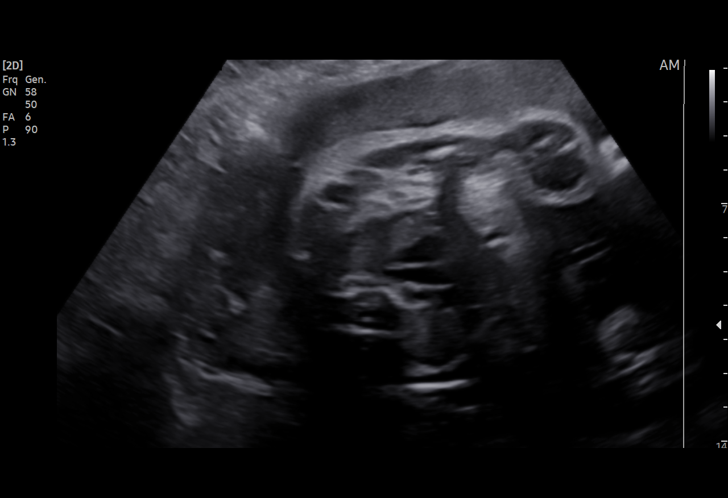
[im 43/45]
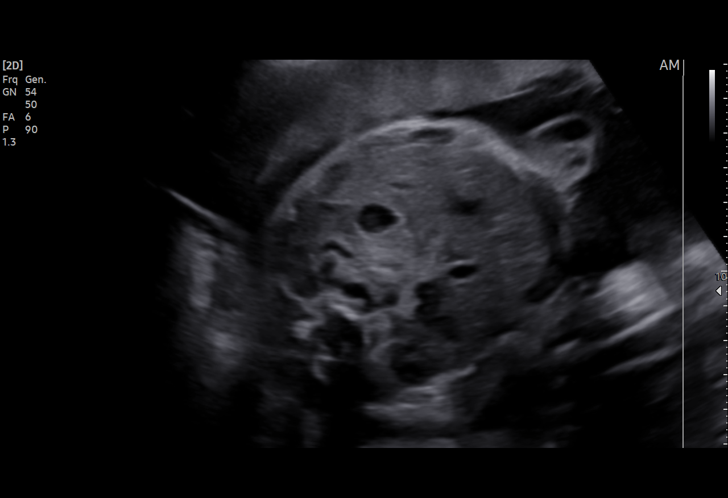

[13 of 28 positions shown; findings below may reference images not displayed]

W/NONSTRESS

Indications

 33 weeks gestation of pregnancy
 Maternal care for known or suspected poor
 fetal growth, third trimester, fetus 1 IUGR
 Gestational diabetes in pregnancy,
 controlled by oral hypoglycemic drugs
 Hypertension - Chronic/Pre-existing (no
 meds)
 Obesity complicating pregnancy, third
 trimester (BMI 38)
 Other mental disorder complicating
 pregnancy, third trimester (zoloft)
 LR NIPS, Neg AFP
Fetal Evaluation

 Num Of Fetuses:         1
 Fetal Heart Rate(bpm):  140
 Cardiac Activity:       Observed
 Presentation:           Cephalic
 Placenta:               Anterior
 P. Cord Insertion:      Previously Visualized

 Amniotic Fluid
 AFI FV:      Within normal limits

 AFI Sum(cm)     %Tile       Largest Pocket(cm)
 11.6            31

 RUQ(cm)       RLQ(cm)       LUQ(cm)        LLQ(cm)
 0
Biophysical Evaluation

 Amniotic F.V:   Within normal limits       F. Tone:        Observed
 F. Movement:    Observed                   N.S.T:          Reactive
 F. Breathing:   Observed                   Score:          [DATE]
Biometry

 LV:        4.1  mm
OB History

 Gravidity:    1         Term:   0        Prem:   0        SAB:   0
 TOP:          0       Ectopic:  0        Living: 0
Gestational Age

 LMP:           33w 5d        Date:  07/21/21                 EDD:   04/27/22
 Best:          33w 5d     Det. By:  LMP  (07/21/21)          EDD:   04/27/22
Anatomy

 Cranium:               Appears normal         Diaphragm:              Appears normal
 Cavum:                 Appears normal         Stomach:                Appears normal, left
                                                                       sided
 Ventricles:            Appears normal         Kidneys:                Appear normal
 Heart:                 Appears normal         Bladder:                Appears normal
                        (4CH, axis, and
                        situs)
Doppler - Fetal Vessels

 Umbilical Artery
  S/D     %tile      RI    %tile      PI    %tile
  2.63       55    0.62       61    0.94       64

Cervix Uterus Adnexa

 Cervix
 Not visualized (advanced GA >82wks)

 Uterus
 No abnormality visualized.

 Right Ovary
 Not visualized.

 Left Ovary
 Not visualized.
Impression

 Fetal growth restriction.  On ultrasound performed 2 weeks
 ago, the estimated fetal weight was at the 3rd percentile.
 Patient has gestational diabetes and takes metformin 4999
 mg at night.
 Blood pressure today at her office is 120/72 mmHg.

 Amniotic fluid is normal and good fetal activity is seen .
 Umbilical artery Doppler showed normal forward diastolic
 flow.  NST is reactive.  BPP [DATE].
 I reassured the patient of the findings.
Recommendations

 - Fetal growth assessment next week.
 -Continue weekly antenatal testing till delivery.
 -We will recommend timing of delivery next week.
                 Gummer, Zoolian

## 2023-08-01 ENCOUNTER — Other Ambulatory Visit: Payer: BC Managed Care – PPO

## 2023-08-01 ENCOUNTER — Encounter: Payer: BC Managed Care – PPO | Admitting: Family Medicine

## 2023-08-14 DIAGNOSIS — R7309 Other abnormal glucose: Secondary | ICD-10-CM | POA: Diagnosis not present

## 2023-08-14 IMAGING — US US MFM FETAL BPP W/ NON-STRESS
1 series · 14 of 28 positions shown · non-contrast
Comparison: none

[Series 1: us mfm fetal bpp w/ non-stress · 38 acquisitions, 14 frames shown]
[im 2/38]
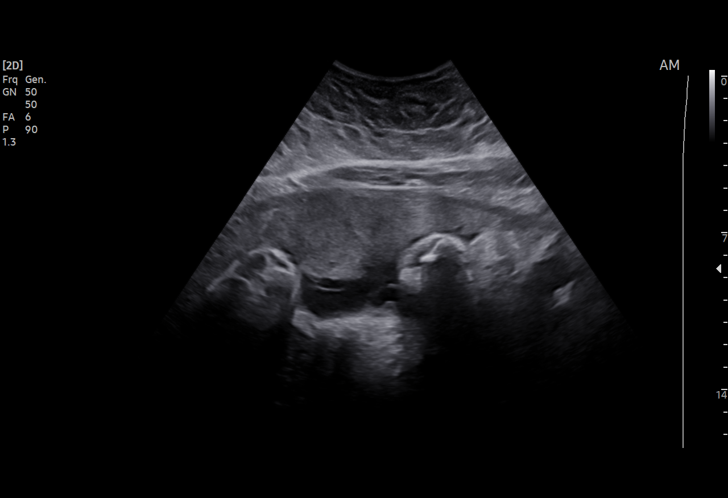
[im 5/38]
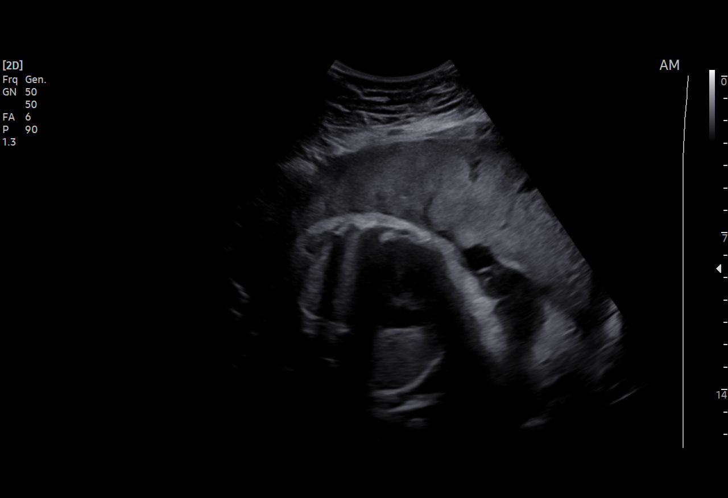
[im 7/38]
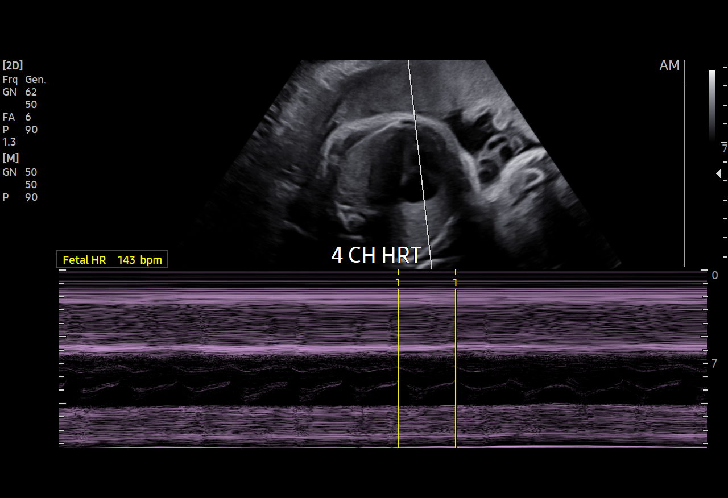
[im 10/38]
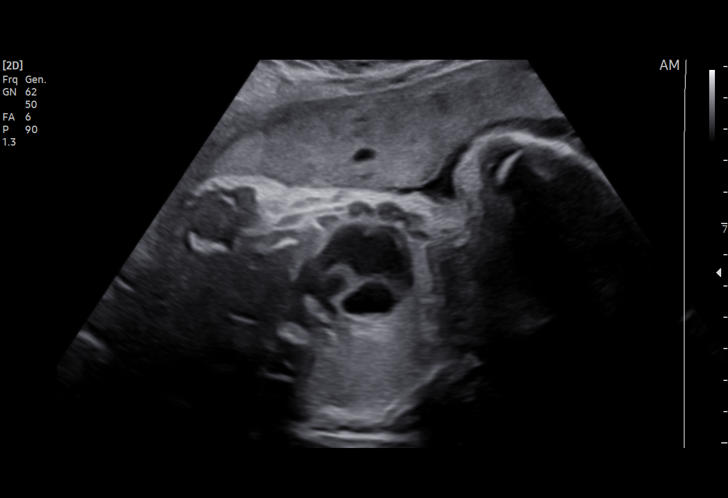
[im 13/38]
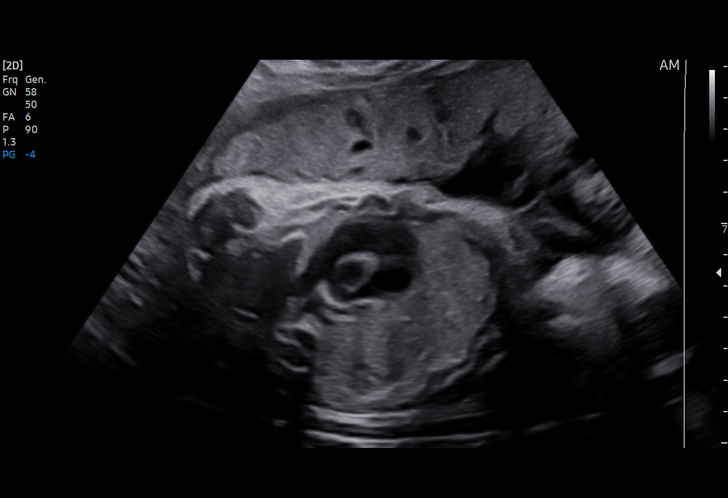
[im 16/38]
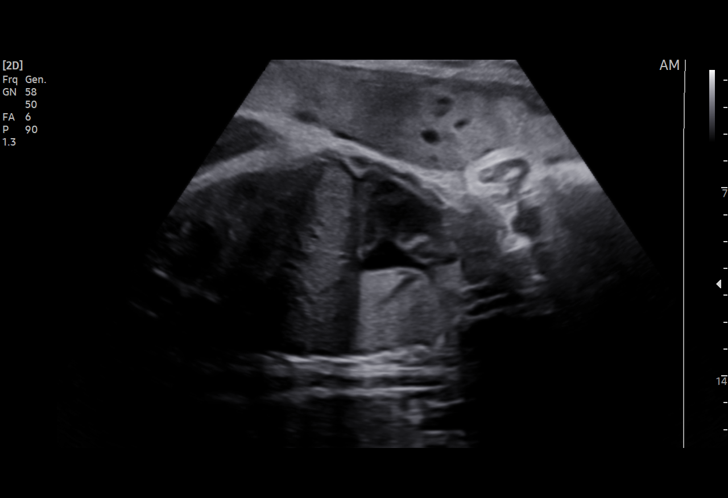
[im 18/38]
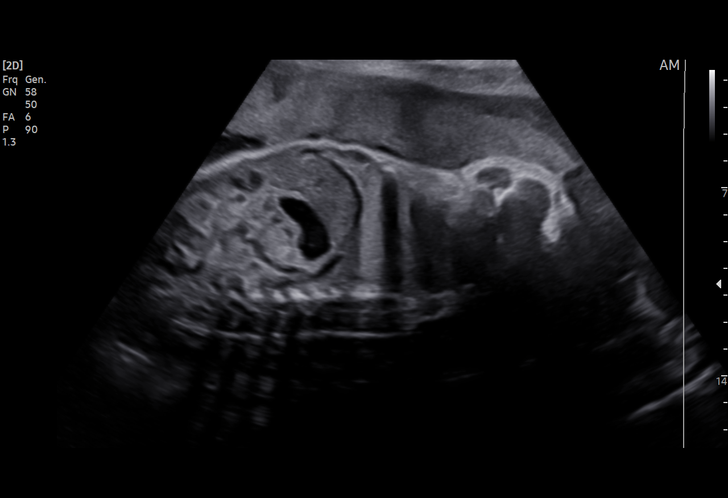
[im 21/38]
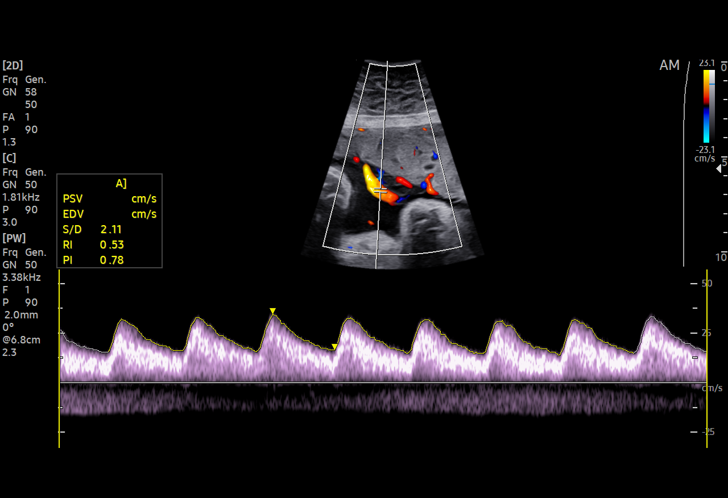
[im 24/38]
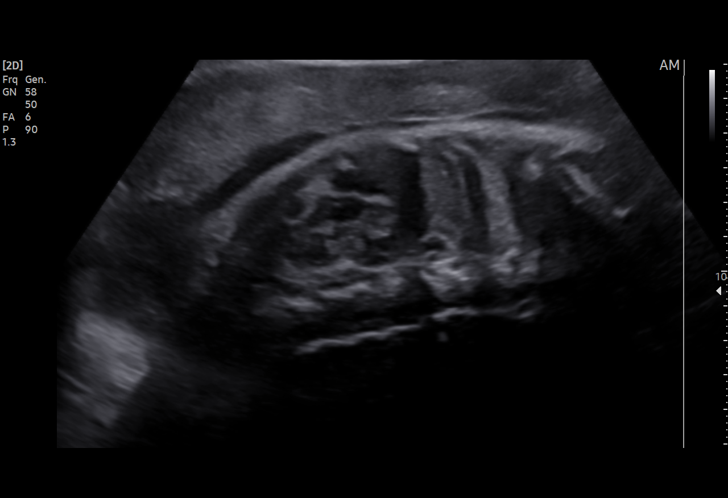
[im 27/38]
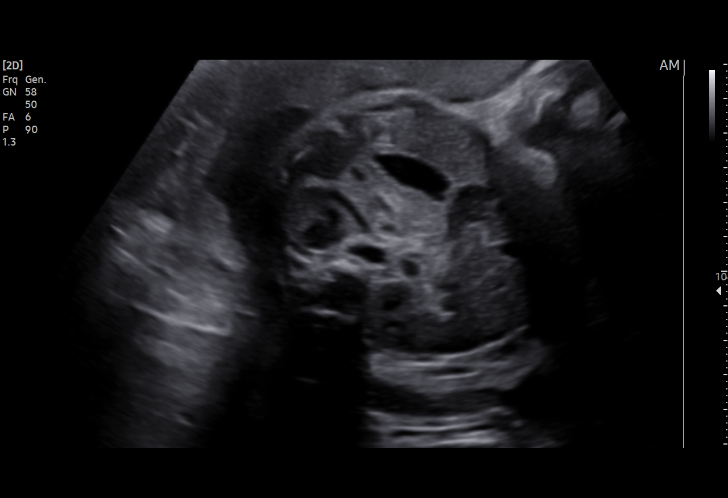
[im 29/38]
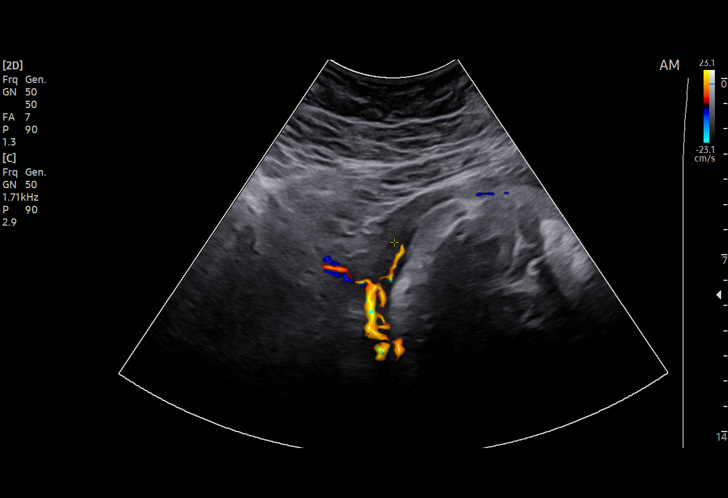
[im 32/38]
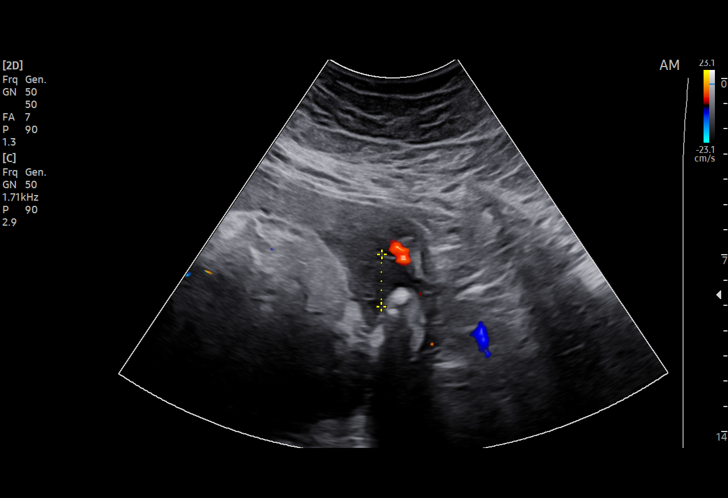
[im 35/38]
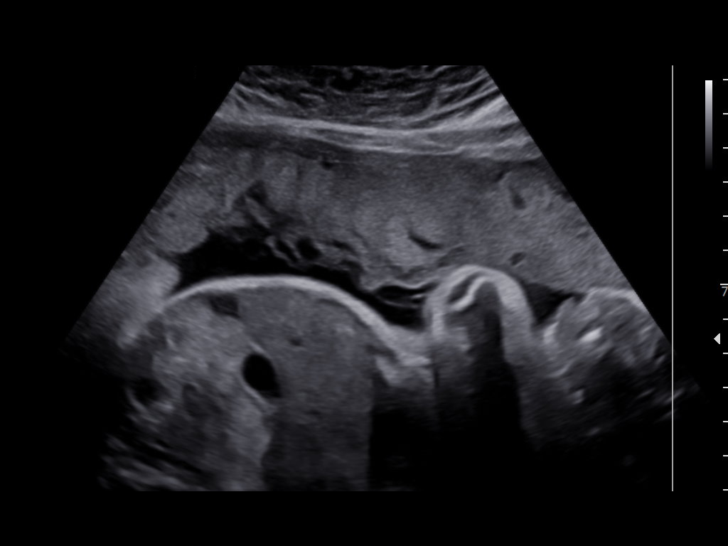
[im 38/38]
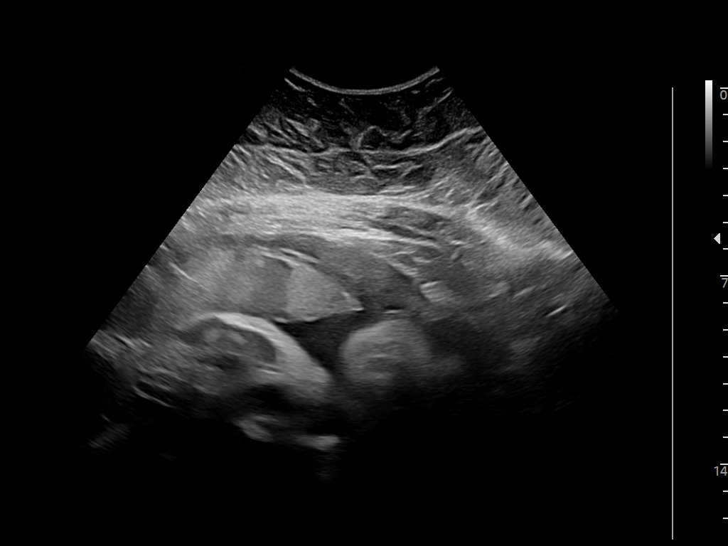

[14 of 28 positions shown; findings below may reference images not displayed]

1  US MFM UA CORD DOPPLER                76820.02    MARIELI
                                                      YONIS
    LESLEY/NONSTRESS                                       YONIS

Indications

 35 weeks gestation of pregnancy
 Maternal care for known or suspected poor
 fetal growth, third trimester, fetus 1 IUGR
 Gestational diabetes in pregnancy,
 controlled by oral hypoglycemic drugs
 Hypertension - Chronic/Pre-existing (no
 meds)
 Obesity complicating pregnancy, third
 trimester (BMI 38)
 Other mental disorder complicating
 pregnancy, third trimester (zoloft)
 LR NIPS, Neg AFP
Fetal Evaluation

 Num Of Fetuses:         1
 Fetal Heart Rate(bpm):  143
 Cardiac Activity:       Observed
 Presentation:           Cephalic
 Placenta:               Anterior
 P. Cord Insertion:      Previously Visualized

 Amniotic Fluid
 AFI FV:      Within normal limits

 AFI Sum(cm)     %Tile       Largest Pocket(cm)
 9.4             17
 RUQ(cm)       RLQ(cm)       LUQ(cm)        LLQ(cm)
 0             2.1           4.3            3
Biophysical Evaluation

 Amniotic F.V:   Within normal limits       F. Tone:        Observed
 F. Movement:    Observed                   N.S.T:          Reactive
 F. Breathing:   Observed                   Score:          [DATE]
OB History

 Gravidity:    1         Term:   0        Prem:   0        SAB:   0
 TOP:          0       Ectopic:  0        Living: 0
Gestational Age

 LMP:           35w 5d        Date:  07/21/21                 EDD:   04/27/22
 Best:          35w 5d     Det. By:  LMP  (07/21/21)          EDD:   04/27/22
Anatomy

 Heart:                 Appears normal         Stomach:                Appears normal, left
                        (4CH, axis, and                                sided
                        situs)
 RVOT:                  Appears normal         Kidneys:                Appear normal
 LVOT:                  Appears normal         Bladder:                Appears normal
 Diaphragm:             Appears normal
Doppler - Fetal Vessels

 Umbilical Artery
  S/D     %tile      RI    %tile      PI    %tile
  2.49       55     0.6       61    0.93       70

Impression

 Antenatal testing due to severe FGR
 Biophysical profile [DATE] with good fetal movement and
 amniotic fluid volume
 UA Dopplers are normal with no evidence of AEDF or REDF

 She is being delivered at 37 weeks.

## 2023-08-25 ENCOUNTER — Other Ambulatory Visit: Payer: Self-pay | Admitting: Certified Nurse Midwife

## 2023-08-27 ENCOUNTER — Ambulatory Visit: Payer: BC Managed Care – PPO | Admitting: Family

## 2023-08-28 ENCOUNTER — Other Ambulatory Visit: Payer: BC Managed Care – PPO

## 2023-08-28 ENCOUNTER — Encounter: Payer: Self-pay | Admitting: Obstetrics and Gynecology

## 2023-08-28 ENCOUNTER — Ambulatory Visit: Payer: BC Managed Care – PPO | Admitting: Obstetrics and Gynecology

## 2023-08-28 VITALS — BP 134/83 | HR 82 | Wt 242.6 lb

## 2023-08-28 DIAGNOSIS — Z3040 Encounter for surveillance of contraceptives, unspecified: Secondary | ICD-10-CM | POA: Diagnosis not present

## 2023-08-28 DIAGNOSIS — O24414 Gestational diabetes mellitus in pregnancy, insulin controlled: Secondary | ICD-10-CM | POA: Diagnosis not present

## 2023-08-28 DIAGNOSIS — Z30017 Encounter for initial prescription of implantable subdermal contraceptive: Secondary | ICD-10-CM | POA: Diagnosis not present

## 2023-08-28 DIAGNOSIS — Z3202 Encounter for pregnancy test, result negative: Secondary | ICD-10-CM | POA: Diagnosis not present

## 2023-08-28 LAB — POCT URINE PREGNANCY: Preg Test, Ur: NEGATIVE

## 2023-08-28 MED ORDER — ETONOGESTREL 68 MG ~~LOC~~ IMPL
68.0000 mg | DRUG_IMPLANT | Freq: Once | SUBCUTANEOUS | Status: AC
Start: 2023-08-28 — End: 2023-08-28
  Administered 2023-08-28: 68 mg via SUBCUTANEOUS

## 2023-08-28 NOTE — Progress Notes (Signed)
    Post Partum Visit Note  Tina Dixon is a 32 y.o. G2P2002 sp 9/2 SVD/2nd degree at 37wks after IOL for GDMa2, CHTN with PP HTN.  Pregnancy also c/b BMI 40s, h/o anxiety/depression.   Anesthesia: epidural. Postpartum course has been uncomplicated. Baby is doing well. Baby is feeding by bottle - Similac Advance. Bleeding no bleeding. Bowel function is normal. Bladder function is normal. Patient is sexually active, last two weeks ago and no problems or issues.  Contraception method is Nexplanon. Postpartum depression screening: positive- same as during pregnancy   Edinburgh Postnatal Depression Scale - 08/28/23 1017       Edinburgh Postnatal Depression Scale:  In the Past 7 Days   I have been able to laugh and see the funny side of things. 0    I have looked forward with enjoyment to things. 0    I have blamed myself unnecessarily when things went wrong. 3    I have been anxious or worried for no good reason. 3    I have felt scared or panicky for no good reason. 2    Things have been getting on top of me. 2    I have been so unhappy that I have had difficulty sleeping. 0    I have felt sad or miserable. 1    I have been so unhappy that I have been crying. 0    The thought of harming myself has occurred to me. 0    Edinburgh Postnatal Depression Scale Total 11             Review of Systems Pertinent items noted in HPI and remainder of comprehensive ROS otherwise negative.  Objective:  BP 134/83   Pulse 82   Wt 242 lb 9.6 oz (110 kg)   LMP 10/29/2022   Breastfeeding No   BMI 42.97 kg/m    General: NAD  See procedure note for uncomplicated nexplanon placement.   Labs: UPT negative.   Assessment:   Normal PP visit.   Plan:  *PP: routine care. Nexplanon placed today in prior nexplanon area. - Last pap smear  Diagnosis  Date Value Ref Range Status  10/19/2021   Final   - Negative for intraepithelial lesion or malignancy (NILM)   *CHTN: patient stopped procardia  a week ago and BP wnl *GDMA2: f/u GTT done today.   RTC: one year for pap  Oto Bing, MD [facility] [employer], [facility]

## 2023-08-29 ENCOUNTER — Encounter: Payer: Self-pay | Admitting: Obstetrics and Gynecology

## 2023-08-29 DIAGNOSIS — Z8632 Personal history of gestational diabetes: Secondary | ICD-10-CM | POA: Insufficient documentation

## 2023-08-29 LAB — GLUCOSE TOLERANCE, 2 HOURS
Glucose, 2 hour: 98 mg/dL (ref 70–139)
Glucose, GTT - Fasting: 97 mg/dL (ref 70–99)

## 2023-09-05 ENCOUNTER — Ambulatory Visit: Payer: BC Managed Care – PPO | Admitting: Family

## 2023-09-09 DIAGNOSIS — E669 Obesity, unspecified: Secondary | ICD-10-CM | POA: Diagnosis not present

## 2023-09-10 ENCOUNTER — Ambulatory Visit: Payer: BC Managed Care – PPO | Admitting: Family

## 2023-09-12 ENCOUNTER — Encounter: Payer: Self-pay | Admitting: Obstetrics and Gynecology

## 2023-09-12 ENCOUNTER — Telehealth: Payer: Self-pay | Admitting: Clinical

## 2023-09-12 HISTORY — PX: INSERTION OF IMPLANON ROD: OBO 1005

## 2023-09-12 NOTE — Addendum Note (Signed)
Addended by: [place] Bing on: 09/12/2023 04:10 PM   Modules accepted: Level of Service

## 2023-09-12 NOTE — Procedures (Signed)
Nexplanon Insertion Procedure Note Prior to the procedure being performed, the patient (or guardian) was asked to state their full name, date of birth, type of procedure being performed and the exact location of the operative site. This information was then checked against the documentation in the patient's chart. Prior to the procedure being performed, a "time out" was performed by the physician that confirmed the correct patient, procedure and site.  After informed consent was obtained, the patient's non-dominant left arm was chosen for insertion at the prior insertion site. The area was cleaned with alcohol then local anesthesia was infiltrated with 3 ml of 1% lidocaine along the planned insertion track. The area was prepped with betadine. Using sterile technique the Nexplanon device was inserted per manufacturer's guidelines in the subdermal connective tissue using the standard insertion technique without difficulty. Pressure was applied and the insertion site was hemostatic. The presence of the Nexplanon was confirmed immediately after insertion by palpation by both me and the patient and by checking the tip of needle for the absence of the insert.  A pressure dressing was applied.  The patient tolerated the procedure well.  Cornelia Copa MD Attending [facility] [employer] Midwife)

## 2023-09-12 NOTE — Telephone Encounter (Signed)
Attempt call regarding referral; Left HIPPA-compliant message to call back [name] from [facility] at  [phone] ([name]'s office).    

## 2023-09-14 DIAGNOSIS — R7309 Other abnormal glucose: Secondary | ICD-10-CM | POA: Diagnosis not present

## 2023-09-19 ENCOUNTER — Ambulatory Visit: Payer: BC Managed Care – PPO | Admitting: Family

## 2023-09-21 ENCOUNTER — Encounter: Payer: Self-pay | Admitting: Family

## 2023-09-21 ENCOUNTER — Ambulatory Visit (INDEPENDENT_AMBULATORY_CARE_PROVIDER_SITE_OTHER): Payer: BC Managed Care – PPO | Admitting: Family

## 2023-09-21 VITALS — BP 122/84 | HR 66 | Temp 97.9°F | Ht 63.0 in | Wt 231.4 lb

## 2023-09-21 DIAGNOSIS — Z8632 Personal history of gestational diabetes: Secondary | ICD-10-CM | POA: Diagnosis not present

## 2023-09-21 DIAGNOSIS — Z6841 Body Mass Index (BMI) 40.0 and over, adult: Secondary | ICD-10-CM

## 2023-09-21 DIAGNOSIS — R5383 Other fatigue: Secondary | ICD-10-CM | POA: Diagnosis not present

## 2023-09-21 DIAGNOSIS — Z23 Encounter for immunization: Secondary | ICD-10-CM | POA: Diagnosis not present

## 2023-09-21 DIAGNOSIS — D500 Iron deficiency anemia secondary to blood loss (chronic): Secondary | ICD-10-CM | POA: Diagnosis not present

## 2023-09-21 LAB — BASIC METABOLIC PANEL
BUN: 18 mg/dL (ref 6–23)
CO2: 25 meq/L (ref 19–32)
Calcium: 9.3 mg/dL (ref 8.4–10.5)
Chloride: 106 meq/L (ref 96–112)
Creatinine, Ser: 0.88 mg/dL (ref 0.40–1.20)
GFR: 87.39 mL/min (ref >60.00)
Glucose, Bld: 87 mg/dL (ref 70–99)
Potassium: 4.3 meq/L (ref 3.5–5.1)
Sodium: 138 meq/L (ref 135–145)

## 2023-09-21 LAB — BRAIN NATRIURETIC PEPTIDE: Pro B Natriuretic peptide (BNP): 10 pg/mL (ref 0.0–100.0)

## 2023-09-21 LAB — IBC + FERRITIN
Ferritin: 27.6 ng/mL (ref 10.0–291.0)
Iron: 41 ug/dL — ABNORMAL LOW (ref 42–145)
Saturation Ratios: 10.1 % — ABNORMAL LOW (ref 20.0–50.0)
TIBC: 404.6 ug/dL (ref 250.0–450.0)
Transferrin: 289 mg/dL (ref 212.0–360.0)

## 2023-09-21 LAB — CBC
HCT: 40.8 % (ref 36.0–46.0)
Hemoglobin: 13.4 g/dL (ref 12.0–15.0)
MCHC: 32.8 g/dL (ref 30.0–36.0)
MCV: 91.2 fL (ref 78.0–100.0)
Platelets: 412 10*3/uL — ABNORMAL HIGH (ref 150.0–400.0)
RBC: 4.48 Mil/uL (ref 3.87–5.11)
RDW: 16 % — ABNORMAL HIGH (ref 11.5–15.5)
WBC: 6.3 10*3/uL (ref 4.0–10.5)

## 2023-09-21 LAB — VITAMIN B12: Vitamin B-12: 475 pg/mL (ref 211–911)

## 2023-09-21 LAB — TSH: TSH: 2.04 u[IU]/mL (ref 0.35–5.50)

## 2023-09-21 LAB — HEMOGLOBIN A1C: Hgb A1c MFr Bld: 5.1 % (ref 4.6–6.5)

## 2023-09-21 MED ORDER — ZEPBOUND 7.5 MG/0.5ML ~~LOC~~ SOAJ: 7.5000 mg | SUBCUTANEOUS | 1 refills | Status: DC

## 2023-09-21 MED ORDER — TIRZEPATIDE-WEIGHT MANAGEMENT 5 MG/0.5ML ~~LOC~~ SOLN
5.0000 mg | SUBCUTANEOUS | 0 refills | Status: DC
Start: 2023-09-21 — End: 2023-11-26

## 2023-09-21 NOTE — Progress Notes (Unsigned)
Established Patient Office Visit  Subjective:      CC:  Chief Complaint  Patient presents with   Medical Management of Chronic Issues    7 weeks postpartum. Currently on Zepbound 2.5mg  and wants to switch to [facility].    HPI: Tina Dixon is a 32 y.o. female presenting on 09/21/2023 for Medical Management of Chronic Issues (7 weeks postpartum. Currently on Zepbound 2.5mg  and wants to switch to [facility].) . 07/16/23 had a baby boy, doing well postpartum. Recently had six week f/u with GYN, all went well. Has nexplanon in place as well.   Obesity: prior to getting pregnant she was taking phentermine, and in the month prior had 20 pounds weight loss and was successful. She is currently on zepbound from [employer], paying 139$ for just the medication. Started on zepbound for online program ROE last month. Doing well and has lost ten pounds, currently on 2.5 mg weekly.  Exercise: walking throughout the week also has a walking pad for her home.  Diet: focusing on choosing better foods, cut out regular sodas from pregnancy more water and diet sodas. Trying to cut down on sweets as well.   Postpartum hypertension, now resolved. Was on furosemide for elevated BNP, nifedipine. No longer on these medications. Denies sob or DOE or chest pain or palpitations.    Social history:  Relevant past medical, surgical, family and social history reviewed and updated as indicated. Interim medical history since our last visit reviewed.  Allergies and medications reviewed and updated.  DATA REVIEWED: CHART IN EPIC     ROS: Negative unless specifically indicated above in HPI.    Current Outpatient Medications:    [START ON 10/12/2023] tirzepatide (ZEPBOUND) 7.5 MG/0.5ML Pen, Inject 7.5 mg into the skin once a week., Disp: 2 mL, Rfl: 1   tirzepatide 5 MG/0.5ML injection vial, Inject 5 mg into the skin once a week., Disp: 2 mL, Rfl: 0   acetaminophen-caffeine (EXCEDRIN TENSION HEADACHE) 500-65  MG TABS per tablet, Take 2 tablets by mouth 4 (four) times daily as needed., Disp: 60 tablet, Rfl: 0   etonogestrel (NEXPLANON) 68 MG IMPL implant, 1 each by Subdermal route once., Disp: , Rfl:    ibuprofen (ADVIL) 600 MG tablet, Take 1 tablet (600 mg total) by mouth every 6 (six) hours as needed., Disp: 60 tablet, Rfl: 3   sertraline (ZOLOFT) 100 MG tablet, Take 1 tablet (100 mg total) by mouth daily., Disp: 90 tablet, Rfl: 1      Objective:    BP 122/84 (BP Location: Left Arm, Patient Position: Sitting, Cuff Size: Large)   Pulse 66   Temp 97.9 F (36.6 C) (Temporal)   Ht 5\' 3"  (1.6 m)   Wt 231 lb 6.4 oz (105 kg)   SpO2 97%   Breastfeeding No   BMI 40.99 kg/m   Wt Readings from Last 3 Encounters:  09/21/23 231 lb 6.4 oz (105 kg)  08/28/23 242 lb 9.6 oz (110 kg)  07/20/23 242 lb 3.2 oz (109.9 kg)    Physical Exam Constitutional:      General: She is not in acute distress.    Appearance: Normal appearance. She is obese. She is not ill-appearing, toxic-appearing or diaphoretic.  HENT:     Head: Normocephalic.  Cardiovascular:     Rate and Rhythm: Normal rate and regular rhythm.  Pulmonary:     Effort: Pulmonary effort is normal.  Musculoskeletal:        General: Normal range of motion.  Neurological:     General: No focal deficit present.     Mental Status: She is alert and oriented to person, place, and time. Mental status is at baseline.  Psychiatric:        Mood and Affect: Mood normal.        Behavior: Behavior normal.        Thought Content: Thought content normal.        Judgment: Judgment normal.           Assessment & Plan:  Encounter for immunization -     Flu vaccine trivalent PF, 6mos and older(Flulaval,Afluria,Fluarix,Fluzone)  History of gestational diabetes -     POCT glycosylated hemoglobin (Hb A1C) -     Hemoglobin A1c -     Basic metabolic panel  Morbid obesity with body mass index (BMI) of 40.0 to 44.9 in adult [facility]) Assessment & Plan: Pt  advised to work on diet and exercise as tolerated The beneficiary does not have any FDA labeled contraindications to the requested agent including pregnancy, lactation, h/o medullary thyroid cancer or multiple endocrine neoplasia type II.    Orders: -     Tirzepatide-Weight Management; Inject 5 mg into the skin once a week.  Dispense: 2 mL; Refill: 0 -     Zepbound; Inject 7.5 mg into the skin once a week.  Dispense: 2 mL; Refill: 1 -     POCT glycosylated hemoglobin (Hb A1C)  Other fatigue -     CBC -     Vitamin B12 -     Brain natriuretic peptide -     Hemoglobin A1c -     TSH  Low calcium levels -     Basic metabolic panel     Return in about 3 months (around 12/22/2023) for f/u weight loss medication.  Mort Sawyers, MSN, APRN, FNP-C [facility]

## 2023-09-21 NOTE — Assessment & Plan Note (Signed)
Pt advised to work on diet and exercise as tolerated The beneficiary does not have any FDA labeled contraindications to the requested agent including pregnancy, lactation, h/o medullary thyroid cancer or multiple endocrine neoplasia type II.

## 2023-09-26 ENCOUNTER — Ambulatory Visit: Payer: BC Managed Care – PPO | Admitting: Family

## 2023-10-01 ENCOUNTER — Ambulatory Visit: Payer: BC Managed Care – PPO | Admitting: Family

## 2023-10-14 DIAGNOSIS — R7309 Other abnormal glucose: Secondary | ICD-10-CM | POA: Diagnosis not present

## 2023-10-30 ENCOUNTER — Telehealth: Payer: BC Managed Care – PPO

## 2023-11-14 DIAGNOSIS — R7309 Other abnormal glucose: Secondary | ICD-10-CM | POA: Diagnosis not present

## 2023-11-20 ENCOUNTER — Encounter: Payer: Self-pay | Admitting: Family

## 2023-11-20 NOTE — Telephone Encounter (Unsigned)
 Copied from CRM [id number]. Topic: General - Other >> Nov 20, 2023  3:54 PM Robinson H wrote: Reason for CRM:  Patient returned call to clinic, agent checked notes and call CAL to verify notes in system. One note says for patient to schedule appointment and other note says for patient to call clinic back. Called to verify went to check back on patient and call from CAL disconnected. Returned to call with patient and patient wasn't on the line. Patient was reaching back out regarding message sent to provider today about not feeling well.

## 2023-11-20 NOTE — Telephone Encounter (Signed)
 Schedule office visit. We might need labs.

## 2023-11-20 NOTE — Telephone Encounter (Signed)
 Have lvm for patient to call back.

## 2023-11-26 ENCOUNTER — Ambulatory Visit: Payer: BC Managed Care – PPO | Admitting: Family

## 2023-11-26 VITALS — BP 110/78 | HR 88 | Temp 98.0°F | Ht 63.0 in | Wt 209.0 lb

## 2023-11-26 DIAGNOSIS — D500 Iron deficiency anemia secondary to blood loss (chronic): Secondary | ICD-10-CM | POA: Diagnosis not present

## 2023-11-26 DIAGNOSIS — G4719 Other hypersomnia: Secondary | ICD-10-CM | POA: Diagnosis not present

## 2023-11-26 DIAGNOSIS — R0683 Snoring: Secondary | ICD-10-CM

## 2023-11-26 DIAGNOSIS — R000 Tachycardia, unspecified: Secondary | ICD-10-CM | POA: Insufficient documentation

## 2023-11-26 DIAGNOSIS — Z6841 Body Mass Index (BMI) 40.0 and over, adult: Secondary | ICD-10-CM

## 2023-11-26 DIAGNOSIS — R0689 Other abnormalities of breathing: Secondary | ICD-10-CM

## 2023-11-26 LAB — CBC
HCT: 43 % (ref 36.0–46.0)
Hemoglobin: 14.2 g/dL (ref 12.0–15.0)
MCHC: 33.1 g/dL (ref 30.0–36.0)
MCV: 91.6 fL (ref 78.0–100.0)
Platelets: 379 10*3/uL (ref 150.0–400.0)
RBC: 4.7 Mil/uL (ref 3.87–5.11)
RDW: 13.1 % (ref 11.5–15.5)
WBC: 8 10*3/uL (ref 4.0–10.5)

## 2023-11-26 LAB — IBC + FERRITIN
Ferritin: 27 ng/mL (ref 10.0–291.0)
Iron: 55 ug/dL (ref 42–145)
Saturation Ratios: 15.7 % — ABNORMAL LOW (ref 20.0–50.0)
TIBC: 350 ug/dL (ref 250.0–450.0)
Transferrin: 250 mg/dL (ref 212.0–360.0)

## 2023-11-26 LAB — TSH: TSH: 1.83 u[IU]/mL (ref 0.35–5.50)

## 2023-11-26 MED ORDER — ZEPBOUND 10 MG/0.5ML ~~LOC~~ SOAJ: 10.0000 mg | SUBCUTANEOUS | 2 refills | Status: DC

## 2023-11-26 NOTE — Assessment & Plan Note (Signed)
 Increase zepbound to 10 mg weekly  Pt advised to work on diet and exercise as tolerated

## 2023-11-26 NOTE — Assessment & Plan Note (Deleted)
 States not noticing this at home, currently asymptomatic.  Advised pt to monitor her heart rate at home and report back end of week.

## 2023-11-26 NOTE — Assessment & Plan Note (Signed)
 Iron panel ordered pending results

## 2023-11-26 NOTE — Assessment & Plan Note (Signed)
 Sleep study ordered Ibc ferritin cbc ordered R/o IDA worsening and also sleep apnea Advised to start b12 500 mcg once daily

## 2023-11-26 NOTE — Progress Notes (Signed)
 Established Patient Office Visit  Subjective:   Patient ID: Tina Dixon, female    DOB: [birth year]  Age: 33 y.o. MRN: [id number]  CC:  Chief Complaint  Patient presents with   Fatigue    HPI: Tina Dixon is a 33 y.o. female presenting on 11/26/2023 for Fatigue  Feeling constant fatigue. She states she has felt this way ever since she gave birth to her son four months ago. She states menses is irregular, last full period was in November. No chance of pregnancy. Spotting almost every day, brown discharge. She is still with nexplanon  in place. She does snore at night and her husband states she gasps for breath. Sleeping well for 8-10 hours a night but often feels she is not rested. Needs naps throughout the day. Tsh normal range 09/21/23.   Obesity, losing weight doing well on zepbound . No symptoms with this other than slight nausea. Currently on 7.5 mg weekly. She wants to stay on the 7.5 mg once weekly.   Wt Readings from Last 3 Encounters:  11/26/23 209 lb (94.8 kg)  09/21/23 231 lb 6.4 oz (105 kg)  08/28/23 242 lb 9.6 oz (110 kg)   Vitamin b12 on lower end, did not start taking otc b12 when I checked it last visit.         ROS: Negative unless specifically indicated above in HPI.   Relevant past medical history reviewed and updated as indicated.   Allergies and medications reviewed and updated.   Current Outpatient Medications:    acetaminophen -caffeine  (EXCEDRIN  TENSION HEADACHE) 500-65 MG TABS per tablet, Take 2 tablets by mouth 4 (four) times daily as needed., Disp: 60 tablet, Rfl: 0   etonogestrel  (NEXPLANON ) 68 MG IMPL implant, 1 each by Subdermal route once., Disp: , Rfl:    ibuprofen  (ADVIL ) 600 MG tablet, Take 1 tablet (600 mg total) by mouth every 6 (six) hours as needed., Disp: 60 tablet, Rfl: 3   sertraline  (ZOLOFT ) 100 MG tablet, Take 1 tablet (100 mg total) by mouth daily., Disp: 90 tablet, Rfl: 1   tirzepatide  (ZEPBOUND ) 10 MG/0.5ML Pen, Inject 10 mg into  the skin once a week., Disp: 2 mL, Rfl: 2  No Known Allergies  Objective:   BP 110/78   Pulse 88   Temp 98 F (36.7 C) (Temporal)   Ht 5' 3 (1.6 m)   Wt 209 lb (94.8 kg)   SpO2 98%   BMI 37.02 kg/m    Manual heart rate 88 bpm   Physical Exam Constitutional:      General: She is not in acute distress.    Appearance: Normal appearance. She is obese. She is not ill-appearing, toxic-appearing or diaphoretic.  HENT:     Head: Normocephalic.  Cardiovascular:     Rate and Rhythm: Normal rate and regular rhythm.  Pulmonary:     Effort: Pulmonary effort is normal.  Musculoskeletal:        General: Normal range of motion.  Neurological:     General: No focal deficit present.     Mental Status: She is alert and oriented to person, place, and time. Mental status is at baseline.  Psychiatric:        Mood and Affect: Mood normal.        Behavior: Behavior normal.        Thought Content: Thought content normal.        Judgment: Judgment normal.     Assessment & Plan:  Morbid obesity with body mass index (  BMI) of 40.0 to 44.9 in adult [facility]) Assessment & Plan: Increase zepbound  to 10 mg weekly  Pt advised to work on diet and exercise as tolerated   Orders: -     Zepbound ; Inject 10 mg into the skin once a week.  Dispense: 2 mL; Refill: 2 -     Ambulatory referral to Sleep Studies  Excessive daytime sleepiness Assessment & Plan: Sleep study ordered Ibc ferritin cbc ordered R/o IDA worsening and also sleep apnea Advised to start b12 500 mcg once daily   Orders: -     Ambulatory referral to Sleep Studies -     TSH  Gasping for breath -     Ambulatory referral to Sleep Studies  Snoring -     Ambulatory referral to Sleep Studies  Iron deficiency anemia due to chronic blood loss Assessment & Plan: Iron panel ordered pending results  Orders: -     CBC -     IBC + Ferritin  Increased heart rate     Follow up plan: Return if symptoms worsen or fail to  improve.  Ginger Patrick, FNP

## 2023-11-27 ENCOUNTER — Encounter: Payer: Self-pay | Admitting: Family

## 2023-11-29 ENCOUNTER — Encounter: Payer: Self-pay | Admitting: *Deleted

## 2023-12-14 DIAGNOSIS — G4730 Sleep apnea, unspecified: Secondary | ICD-10-CM | POA: Diagnosis not present

## 2023-12-15 DIAGNOSIS — R7309 Other abnormal glucose: Secondary | ICD-10-CM | POA: Diagnosis not present

## 2023-12-24 ENCOUNTER — Ambulatory Visit: Payer: BC Managed Care – PPO | Admitting: Family

## 2023-12-26 ENCOUNTER — Ambulatory Visit: Payer: BC Managed Care – PPO | Admitting: Family

## 2024-01-07 ENCOUNTER — Encounter: Payer: Self-pay | Admitting: Family

## 2024-01-07 DIAGNOSIS — G4733 Obstructive sleep apnea (adult) (pediatric): Secondary | ICD-10-CM | POA: Insufficient documentation

## 2024-01-07 DIAGNOSIS — Z8632 Personal history of gestational diabetes: Secondary | ICD-10-CM

## 2024-01-07 MED ORDER — ZEPBOUND 7.5 MG/0.5ML ~~LOC~~ SOAJ: 7.5000 mg | SUBCUTANEOUS | 1 refills | Status: DC

## 2024-01-07 NOTE — Telephone Encounter (Signed)
 Insurance told her she needs updated prior auth

## 2024-01-07 NOTE — Addendum Note (Signed)
 Addended by: Mort Sawyers on: 01/07/2024 02:59 PM   Modules accepted: Orders

## 2024-01-09 ENCOUNTER — Telehealth: Payer: Self-pay

## 2024-01-09 NOTE — Telephone Encounter (Signed)
 Pharmacy Patient Advocate Encounter  Received notification from [facility] that Prior Authorization for Zepbound 7.5MG /0.5ML has been APPROVED from 01/09/2024 to 07/08/2024   PA #/Case ID/Reference #: [id number]

## 2024-01-09 NOTE — Telephone Encounter (Signed)
 PA request has been Approved. New Encounter created for follow up. For additional info see Pharmacy Prior Auth telephone encounter from 01/09/2024.

## 2024-01-09 NOTE — Telephone Encounter (Signed)
 Pharmacy Patient Advocate Encounter   Received notification from Patient Advice Request messages that prior authorization for Zepbound 7.5MG /0.5ML pen-injectors is required/requested.   Insurance verification completed.   The patient is insured through [facility] .   Per test claim: PA required; PA submitted to above mentioned insurance via CoverMyMeds Key/confirmation #/EOC [facility] Status is pending

## 2024-01-10 ENCOUNTER — Other Ambulatory Visit (HOSPITAL_COMMUNITY): Payer: Self-pay

## 2024-01-10 ENCOUNTER — Telehealth: Payer: Self-pay | Admitting: Pharmacy Technician

## 2024-01-10 NOTE — Telephone Encounter (Signed)
 Pharmacy Patient Advocate Encounter   Received notification from CoverMyMeds that prior authorization for ZEPBOUND 10MG /0.5ML PEN is required/requested.   Insurance verification completed.   The patient is insured through [facility] .   ZEPBOUND 10MG /0.5ML PEN HAS BEEN DISCONTINUED AND CHANGED TO ZEPBOUND 7.5MG /0.5ML PEN. RAN TEST CLAIM AND THE 7.5MG  WILL PROCESS WITH A COPAY OF $135 FOR A 3 MONTH SUPPLY.

## 2024-01-11 ENCOUNTER — Encounter: Payer: Self-pay | Admitting: Family

## 2024-01-12 DIAGNOSIS — R7309 Other abnormal glucose: Secondary | ICD-10-CM | POA: Diagnosis not present

## 2024-01-21 ENCOUNTER — Encounter

## 2024-02-12 DIAGNOSIS — R7309 Other abnormal glucose: Secondary | ICD-10-CM | POA: Diagnosis not present

## 2024-03-13 ENCOUNTER — Telehealth: Payer: Self-pay | Admitting: Family

## 2024-03-13 DIAGNOSIS — R7309 Other abnormal glucose: Secondary | ICD-10-CM | POA: Diagnosis not present

## 2024-03-13 NOTE — Telephone Encounter (Signed)
 Copied from CRM [id number]. Topic: General - Other >> Mar 13, 2024 10:25 AM Annelle Kiel wrote: Reason for CRM: patient order for cpap has been canceled [employer] is unable to reach patient

## 2024-03-13 NOTE — Telephone Encounter (Signed)
 Noted.

## 2024-04-02 DIAGNOSIS — G4733 Obstructive sleep apnea (adult) (pediatric): Secondary | ICD-10-CM | POA: Diagnosis not present

## 2024-04-13 DIAGNOSIS — R7309 Other abnormal glucose: Secondary | ICD-10-CM | POA: Diagnosis not present

## 2024-04-28 ENCOUNTER — Encounter: Payer: Self-pay | Admitting: Family

## 2024-04-28 ENCOUNTER — Ambulatory Visit: Admitting: Family

## 2024-04-28 VITALS — BP 106/72 | HR 97 | Temp 98.7°F | Ht 63.0 in | Wt 167.4 lb

## 2024-04-28 DIAGNOSIS — Z6841 Body Mass Index (BMI) 40.0 and over, adult: Secondary | ICD-10-CM | POA: Diagnosis not present

## 2024-04-28 DIAGNOSIS — R110 Nausea: Secondary | ICD-10-CM | POA: Diagnosis not present

## 2024-04-28 DIAGNOSIS — G4733 Obstructive sleep apnea (adult) (pediatric): Secondary | ICD-10-CM | POA: Diagnosis not present

## 2024-04-28 MED ORDER — ZEPBOUND 10 MG/0.5ML ~~LOC~~ SOAJ: 10.0000 mg | SUBCUTANEOUS | 1 refills | Status: DC

## 2024-04-28 MED ORDER — ONDANSETRON HCL 4 MG PO TABS: 4.0000 mg | ORAL_TABLET | Freq: Three times a day (TID) | ORAL | 0 refills | Status: AC | PRN

## 2024-04-28 NOTE — Progress Notes (Signed)
 Established Patient Office Visit  Subjective:      CC:  Chief Complaint  Patient presents with   Medical Management of Chronic Issues    HPI: Tina Dixon is a 33 y.o. female presenting on 04/28/2024 for Medical Management of Chronic Issues  Obesity: doing really well on zepbound  10 mg weekly and tolerating well. No longer with nausea. Would like to stay on this dose for now.   Exercise: walking several times a week, has a walking pad. Up to one hour several times a week. Diet: doing well on portion control and helping her to choose better options.   Wt Readings from Last 3 Encounters:  04/28/24 167 lb 6.4 oz (75.9 kg)  11/26/23 209 lb (94.8 kg)  09/21/23 231 lb 6.4 oz (105 kg)   Moderate OSA: just started on CPAP a few weeks ago, she had been waiting since March. She has noticed that she has been feeling a lot better as far as sleeping since her weight loss. She states it is hard to sleep with the CPAP because it was not that comfortable. No morning headaches, no snoring anymore. No gasping for breath at night time.    Social history:  Relevant past medical, surgical, family and social history reviewed and updated as indicated. Interim medical history since our last visit reviewed.  Allergies and medications reviewed and updated.  DATA REVIEWED: CHART IN EPIC     ROS: Negative unless specifically indicated above in HPI.    Current Outpatient Medications:    acetaminophen -caffeine  (EXCEDRIN  TENSION HEADACHE) 500-65 MG TABS per tablet, Take 2 tablets by mouth 4 (four) times daily as needed., Disp: 60 tablet, Rfl: 0   etonogestrel  (NEXPLANON ) 68 MG IMPL implant, 1 each by Subdermal route once., Disp: , Rfl:    ibuprofen  (ADVIL ) 600 MG tablet, Take 1 tablet (600 mg total) by mouth every 6 (six) hours as needed., Disp: 60 tablet, Rfl: 3   ondansetron  (ZOFRAN ) 4 MG tablet, Take 1 tablet (4 mg total) by mouth every 8 (eight) hours as needed for nausea or vomiting., Disp: 20  tablet, Rfl: 0   sertraline  (ZOLOFT ) 100 MG tablet, Take 1 tablet (100 mg total) by mouth daily., Disp: 90 tablet, Rfl: 1   tirzepatide  (ZEPBOUND ) 10 MG/0.5ML Pen, Inject 10 mg into the skin once a week., Disp: 6 mL, Rfl: 1      Objective:    BP 106/72 (BP Location: Right Arm, Patient Position: Sitting, Cuff Size: Normal)   Pulse 97   Temp 98.7 F (37.1 C) (Temporal)   Ht 5' 3 (1.6 m)   Wt 167 lb 6.4 oz (75.9 kg)   SpO2 99%   BMI 29.65 kg/m   Wt Readings from Last 3 Encounters:  04/28/24 167 lb 6.4 oz (75.9 kg)  11/26/23 209 lb (94.8 kg)  09/21/23 231 lb 6.4 oz (105 kg)    Physical Exam Vitals reviewed.  Constitutional:      General: She is not in acute distress.    Appearance: Normal appearance. She is normal weight. She is not ill-appearing, toxic-appearing or diaphoretic.  HENT:     Head: Normocephalic.   Cardiovascular:     Rate and Rhythm: Normal rate.  Pulmonary:     Effort: Pulmonary effort is normal.   Musculoskeletal:        General: Normal range of motion.   Neurological:     General: No focal deficit present.     Mental Status: She is alert and oriented  to person, place, and time. Mental status is at baseline.   Psychiatric:        Mood and Affect: Mood normal.        Behavior: Behavior normal.        Thought Content: Thought content normal.        Judgment: Judgment normal.           Assessment & Plan:  Nausea -     Ondansetron  HCl; Take 1 tablet (4 mg total) by mouth every 8 (eight) hours as needed for nausea or vomiting.  Dispense: 20 tablet; Refill: 0  Morbid obesity with body mass index (BMI) of 40.0 to 44.9 in adult [facility]) Assessment & Plan: Continue zepbound  10 mg weekly Tolerating well.  Pt advised to work on diet and exercise as tolerated   Orders: -     Zepbound ; Inject 10 mg into the skin once a week.  Dispense: 6 mL; Refill: 1  Moderate obstructive sleep apnea Assessment & Plan: Continue with CPAP  Call to see if there are  any modifications that could help with comfort         Return in about 6 months (around 10/28/2024) for f/u CPE.  Felicita Horns, MSN, APRN, FNP-C [facility]

## 2024-04-28 NOTE — Assessment & Plan Note (Signed)
 Continue zepbound  10 mg weekly Tolerating well.  Pt advised to work on diet and exercise as tolerated

## 2024-04-28 NOTE — Assessment & Plan Note (Signed)
 Continue with CPAP  Call to see if there are any modifications that could help with comfort

## 2024-05-03 DIAGNOSIS — G4733 Obstructive sleep apnea (adult) (pediatric): Secondary | ICD-10-CM | POA: Diagnosis not present

## 2024-05-13 DIAGNOSIS — R7309 Other abnormal glucose: Secondary | ICD-10-CM | POA: Diagnosis not present

## 2024-06-09 ENCOUNTER — Encounter: Payer: Self-pay | Admitting: Family

## 2024-06-09 DIAGNOSIS — G4733 Obstructive sleep apnea (adult) (pediatric): Secondary | ICD-10-CM

## 2024-06-09 MED ORDER — ZEPBOUND 12.5 MG/0.5ML ~~LOC~~ SOAJ: 12.5000 mg | SUBCUTANEOUS | 0 refills | Status: DC

## 2024-06-11 ENCOUNTER — Other Ambulatory Visit: Payer: Self-pay | Admitting: Medical Genetics

## 2024-06-13 DIAGNOSIS — R7309 Other abnormal glucose: Secondary | ICD-10-CM | POA: Diagnosis not present

## 2024-06-14 ENCOUNTER — Other Ambulatory Visit: Payer: Self-pay

## 2024-07-11 ENCOUNTER — Other Ambulatory Visit (HOSPITAL_COMMUNITY): Payer: Self-pay

## 2024-07-14 DIAGNOSIS — R7309 Other abnormal glucose: Secondary | ICD-10-CM | POA: Diagnosis not present

## 2024-08-04 ENCOUNTER — Encounter: Payer: Self-pay | Admitting: Family

## 2024-08-04 NOTE — Telephone Encounter (Signed)
 Key: [id number] Message from Plan Request Reference Number: [id number]. ZEPBOUND  INJ 12.5/0.5 is approved through 08/04/2025. Your patient may now fill this prescription and it will be covered.. Authorization Expiration Date: August 04, 2025.

## 2024-08-13 DIAGNOSIS — R7309 Other abnormal glucose: Secondary | ICD-10-CM | POA: Diagnosis not present

## 2024-08-22 ENCOUNTER — Encounter: Payer: Self-pay | Admitting: *Deleted

## 2024-08-22 ENCOUNTER — Other Ambulatory Visit: Payer: Self-pay | Admitting: Medical Genetics

## 2024-08-22 DIAGNOSIS — Z006 Encounter for examination for normal comparison and control in clinical research program: Secondary | ICD-10-CM

## 2024-08-25 DIAGNOSIS — F411 Generalized anxiety disorder: Secondary | ICD-10-CM | POA: Diagnosis not present

## 2024-08-25 DIAGNOSIS — F5105 Insomnia due to other mental disorder: Secondary | ICD-10-CM | POA: Diagnosis not present

## 2024-09-03 DIAGNOSIS — F5105 Insomnia due to other mental disorder: Secondary | ICD-10-CM | POA: Diagnosis not present

## 2024-09-03 DIAGNOSIS — F3342 Major depressive disorder, recurrent, in full remission: Secondary | ICD-10-CM | POA: Diagnosis not present

## 2024-09-03 DIAGNOSIS — F411 Generalized anxiety disorder: Secondary | ICD-10-CM | POA: Diagnosis not present

## 2024-09-04 ENCOUNTER — Telehealth: Admitting: Nurse Practitioner

## 2024-09-04 DIAGNOSIS — B9789 Other viral agents as the cause of diseases classified elsewhere: Secondary | ICD-10-CM | POA: Diagnosis not present

## 2024-09-04 DIAGNOSIS — J019 Acute sinusitis, unspecified: Secondary | ICD-10-CM | POA: Diagnosis not present

## 2024-09-04 DIAGNOSIS — F411 Generalized anxiety disorder: Secondary | ICD-10-CM | POA: Diagnosis not present

## 2024-09-04 DIAGNOSIS — F5105 Insomnia due to other mental disorder: Secondary | ICD-10-CM | POA: Diagnosis not present

## 2024-09-04 DIAGNOSIS — F3342 Major depressive disorder, recurrent, in full remission: Secondary | ICD-10-CM | POA: Diagnosis not present

## 2024-09-04 DIAGNOSIS — F902 Attention-deficit hyperactivity disorder, combined type: Secondary | ICD-10-CM | POA: Diagnosis not present

## 2024-09-04 MED ORDER — IPRATROPIUM BROMIDE 0.03 % NA SOLN
2.0000 | Freq: Two times a day (BID) | NASAL | 0 refills | Status: AC
Start: 2024-09-04 — End: ?

## 2024-09-04 NOTE — Progress Notes (Signed)
 We are sorry that you are not feeling well.  Here is how we plan to help!  Based on what you have shared with me it looks like you have sinusitis.  Sinusitis is inflammation and infection in the sinus cavities of the head.  Based on your presentation I believe you most likely have Acute Viral Sinusitis.  This is an infection most likely caused by a virus. There is not specific treatment for viral sinusitis other than to help you with the symptoms until the infection runs its course usually in 7-10 days.  You may use an oral decongestant such as Mucinex D or if you have glaucoma or high blood pressure use plain Mucinex. Saline nasal spray help and can safely be used as often as needed for congestion, I have prescribed: Ipratropium Bromide nasal spray 0.03% 2 sprays in eah nostril 2-3 times a day  Some authorities believe that zinc sprays or the use of Echinacea may shorten the course of your symptoms.  Sinus infections are not as easily transmitted as other respiratory infection, however we still recommend that you avoid close contact with loved ones, especially the very young and elderly.  Remember to wash your hands thoroughly throughout the day as this is the number one way to prevent the spread of infection!  Home Care: Only take medications as instructed by your medical team. Do not take these medications with alcohol. A steam or ultrasonic humidifier can help congestion.  You can place a towel over your head and breathe in the steam from hot water coming from a faucet. Avoid close contacts especially the very young and the elderly. Cover your mouth when you cough or sneeze. Always remember to wash your hands.  Get Help Right Away If: You develop worsening fever or sinus pain. You develop a severe head ache or visual changes. Your symptoms persist after you have completed your treatment plan.  Make sure you Understand these instructions. Will watch your condition. Will get help right  away if you are not doing well or get worse.  Your e-visit answers were reviewed by a board certified advanced clinical practitioner to complete your personal care plan.  Depending on the condition, your plan could have included both over the counter or prescription medications.  If there is a problem please reply  once you have received a response from your provider.  Your safety is important to us .  If you have drug allergies check your prescription carefully.    You can use MyChart to ask questions about today's visit, request a non-urgent call back, or ask for a work or school excuse for 24 hours related to this e-Visit. If it has been greater than 24 hours you will need to follow up with your provider, or enter a new e-Visit to address those concerns.  You will get an e-mail in the next two days asking about your experience.  I hope that your e-visit has been valuable and will speed your recovery. Thank you for using e-visits.  I have spent 5 minutes in review of e-visit questionnaire, review and updating patient chart, medical decision making and response to patient.   Tina Dixon W Neleh Muldoon, NP

## 2024-09-10 DIAGNOSIS — F3342 Major depressive disorder, recurrent, in full remission: Secondary | ICD-10-CM | POA: Diagnosis not present

## 2024-09-10 DIAGNOSIS — F411 Generalized anxiety disorder: Secondary | ICD-10-CM | POA: Diagnosis not present

## 2024-09-10 DIAGNOSIS — F902 Attention-deficit hyperactivity disorder, combined type: Secondary | ICD-10-CM | POA: Diagnosis not present

## 2024-09-10 DIAGNOSIS — F5105 Insomnia due to other mental disorder: Secondary | ICD-10-CM | POA: Diagnosis not present

## 2024-09-11 ENCOUNTER — Ambulatory Visit: Admitting: Obstetrics and Gynecology

## 2024-09-13 DIAGNOSIS — R7309 Other abnormal glucose: Secondary | ICD-10-CM | POA: Diagnosis not present

## 2024-10-13 DIAGNOSIS — F5105 Insomnia due to other mental disorder: Secondary | ICD-10-CM | POA: Diagnosis not present

## 2024-10-13 DIAGNOSIS — F3342 Major depressive disorder, recurrent, in full remission: Secondary | ICD-10-CM | POA: Diagnosis not present

## 2024-10-13 DIAGNOSIS — F902 Attention-deficit hyperactivity disorder, combined type: Secondary | ICD-10-CM | POA: Diagnosis not present

## 2024-10-13 DIAGNOSIS — F411 Generalized anxiety disorder: Secondary | ICD-10-CM | POA: Diagnosis not present

## 2024-11-03 ENCOUNTER — Encounter: Admitting: Family

## 2024-11-03 ENCOUNTER — Other Ambulatory Visit: Payer: Self-pay | Admitting: *Deleted

## 2024-11-03 DIAGNOSIS — G4733 Obstructive sleep apnea (adult) (pediatric): Secondary | ICD-10-CM

## 2024-11-03 MED ORDER — ZEPBOUND 12.5 MG/0.5ML ~~LOC~~ SOAJ: 12.5000 mg | SUBCUTANEOUS | 2 refills | Status: DC

## 2024-11-11 DIAGNOSIS — F411 Generalized anxiety disorder: Secondary | ICD-10-CM | POA: Diagnosis not present

## 2024-11-11 DIAGNOSIS — F902 Attention-deficit hyperactivity disorder, combined type: Secondary | ICD-10-CM | POA: Diagnosis not present

## 2024-11-11 DIAGNOSIS — F3342 Major depressive disorder, recurrent, in full remission: Secondary | ICD-10-CM | POA: Diagnosis not present

## 2024-12-08 ENCOUNTER — Encounter: Admitting: Family

## 2024-12-11 ENCOUNTER — Encounter: Payer: Self-pay | Admitting: Family

## 2024-12-11 ENCOUNTER — Ambulatory Visit (INDEPENDENT_AMBULATORY_CARE_PROVIDER_SITE_OTHER): Admitting: Family

## 2024-12-11 VITALS — BP 102/62 | HR 101 | Temp 98.1°F | Ht 63.0 in | Wt 124.8 lb

## 2024-12-11 DIAGNOSIS — Z13220 Encounter for screening for lipoid disorders: Secondary | ICD-10-CM | POA: Diagnosis not present

## 2024-12-11 DIAGNOSIS — G4733 Obstructive sleep apnea (adult) (pediatric): Secondary | ICD-10-CM | POA: Diagnosis not present

## 2024-12-11 DIAGNOSIS — Z0000 Encounter for general adult medical examination without abnormal findings: Secondary | ICD-10-CM | POA: Diagnosis not present

## 2024-12-11 DIAGNOSIS — R0683 Snoring: Secondary | ICD-10-CM

## 2024-12-11 DIAGNOSIS — F32A Depression, unspecified: Secondary | ICD-10-CM

## 2024-12-11 DIAGNOSIS — Z6841 Body Mass Index (BMI) 40.0 and over, adult: Secondary | ICD-10-CM

## 2024-12-11 DIAGNOSIS — F419 Anxiety disorder, unspecified: Secondary | ICD-10-CM

## 2024-12-11 DIAGNOSIS — Z6822 Body mass index (BMI) 22.0-22.9, adult: Secondary | ICD-10-CM

## 2024-12-11 DIAGNOSIS — Z23 Encounter for immunization: Secondary | ICD-10-CM

## 2024-12-11 DIAGNOSIS — D500 Iron deficiency anemia secondary to blood loss (chronic): Secondary | ICD-10-CM

## 2024-12-11 LAB — BASIC METABOLIC PANEL WITH GFR
BUN: 15 mg/dL (ref 6–23)
CO2: 30 meq/L (ref 19–32)
Calcium: 9.2 mg/dL (ref 8.4–10.5)
Chloride: 102 meq/L (ref 96–112)
Creatinine, Ser: 0.66 mg/dL (ref 0.40–1.20)
GFR: 115.65 mL/min
Glucose, Bld: 84 mg/dL (ref 70–99)
Potassium: 4 meq/L (ref 3.5–5.1)
Sodium: 137 meq/L (ref 135–145)

## 2024-12-11 LAB — CBC
HCT: 40.7 % (ref 36.0–46.0)
Hemoglobin: 13.7 g/dL (ref 12.0–15.0)
MCHC: 33.7 g/dL (ref 30.0–36.0)
MCV: 93.8 fl (ref 78.0–100.0)
Platelets: 341 10*3/uL (ref 150.0–400.0)
RBC: 4.34 Mil/uL (ref 3.87–5.11)
RDW: 12.9 % (ref 11.5–15.5)
WBC: 6 10*3/uL (ref 4.0–10.5)

## 2024-12-11 LAB — LIPID PANEL
Cholesterol: 191 mg/dL (ref 28–200)
HDL: 54.4 mg/dL
LDL Cholesterol: 125 mg/dL — ABNORMAL HIGH (ref 10–99)
NonHDL: 136.77
Total CHOL/HDL Ratio: 4
Triglycerides: 60 mg/dL (ref 10.0–149.0)
VLDL: 12 mg/dL (ref 0.0–40.0)

## 2024-12-11 LAB — IBC + FERRITIN
Ferritin: 42.1 ng/mL (ref 10.0–291.0)
Iron: 94 ug/dL (ref 42–145)
Saturation Ratios: 28 % (ref 20.0–50.0)
TIBC: 336 ug/dL (ref 250.0–450.0)
Transferrin: 240 mg/dL (ref 212.0–360.0)

## 2024-12-11 MED ORDER — ZEPBOUND 10 MG/0.5ML ~~LOC~~ SOAJ: 10.0000 mg | SUBCUTANEOUS | 1 refills | Status: AC

## 2024-12-11 NOTE — Addendum Note (Signed)
 Addended by: ALBINO SHAVER C on: 12/11/2024 11:38 AM   Modules accepted: Orders

## 2024-12-11 NOTE — Progress Notes (Signed)
 "  Subjective:  Patient ID: Tina Dixon, female    DOB: 03-Jul-1991  Age: 34 y.o. MRN: [id number]  Patient Care Team: Corwin Antu, FNP as PCP - General (Family Medicine)   CC:  Chief Complaint  Patient presents with   Annual Exam    HPI Tina Dixon is a 34 y.o. female who presents today for an annual physical exam. She reports consuming a general diet. The patient does not participate in regular exercise at present. She generally feels well. She reports sleeping well. She does not have additional problems to discuss today.   Vision:Within last year Dental:Receives regular dental care STD:The patient denies history of sexually transmitted disease.  Last pap: 10/19/21 negative   Pt is without acute concerns.   Advanced Directives Patient does not have advanced directives     DEPRESSION SCREENING    12/11/2024   11:12 AM 04/28/2024   11:38 AM 11/26/2023   12:18 PM 09/21/2023    8:04 AM 05/16/2023    9:51 AM 01/24/2023    3:05 PM 09/11/2022   10:05 AM  PHQ 2/9 Scores  PHQ - 2 Score 4 4 4 1 4 2 1   PHQ- 9 Score 12 7  13  6  16  7  3       Data saved with a previous flowsheet row definition     ROS: Negative unless specifically indicated above in HPI.   Current Medications[1]    Objective:    BP 102/62 (BP Location: Left Arm, Patient Position: Sitting, Cuff Size: Normal)   Pulse (!) 101   Temp 98.1 F (36.7 C) (Temporal)   Ht 5' 3 (1.6 m)   Wt 124 lb 12.8 oz (56.6 kg)   SpO2 98%   BMI 22.11 kg/m   BP Readings from Last 3 Encounters:  12/11/24 102/62  04/28/24 106/72  11/26/23 110/78      Physical Exam Constitutional:      General: She is not in acute distress.    Appearance: Normal appearance. She is normal weight. She is not ill-appearing.  HENT:     Head: Normocephalic.     Right Ear: Tympanic membrane normal.     Left Ear: Tympanic membrane normal.     Nose: Nose normal.     Mouth/Throat:     Mouth: Mucous membranes are moist.  Eyes:      Extraocular Movements: Extraocular movements intact.     Pupils: Pupils are equal, round, and reactive to light.  Cardiovascular:     Rate and Rhythm: Normal rate and regular rhythm.  Pulmonary:     Effort: Pulmonary effort is normal.     Breath sounds: Normal breath sounds.  Abdominal:     General: Abdomen is flat. Bowel sounds are normal.     Palpations: Abdomen is soft.     Tenderness: There is no guarding or rebound.  Musculoskeletal:        General: Normal range of motion.     Cervical back: Normal range of motion.  Skin:    General: Skin is warm.     Capillary Refill: Capillary refill takes less than 2 seconds.  Neurological:     General: No focal deficit present.     Mental Status: She is alert.  Psychiatric:        Mood and Affect: Mood normal.        Behavior: Behavior normal.        Thought Content: Thought content normal.  Judgment: Judgment normal.       Results       Assessment & Plan:   Assessment and Plan Assessment & Plan Obesity, status post significant weight loss with ongoing pharmacologic management Significant weight loss from 240 lbs to 124 lbs with ongoing pharmacologic management using Tirzepatide  (Zepbound ). Currently maintaining weight with reduced frequency of dosing. Insurance coverage for Zepbound  is maintained due to prior diagnosis of moderate obstructive sleep apnea. Adderall may contribute to weight loss. - Reduced Tirzepatide  (Zepbound ) to 10 mg weekly for maintenance. - Encouraged strength training exercises to maintain muscle mass and support weight maintenance. - Instructed to monitor for any weight fluctuations and adjust Tirzepatide  dosage as needed.  Moderate obstructive sleep apnea Moderate obstructive sleep apnea, previously managed with CPAP. Symptoms have improved with weight loss, but occasional symptoms such as snoring and daytime fatigue persist. Insurance coverage for Zepbound  is maintained due to this diagnosis. -  Monitor for symptoms of sleep apnea, including snoring, gasping for breath, morning headaches, and excessive daytime fatigue. - Will consider referral to sleep specialist if symptoms persist or worsen.  Anxiety and depression Managed with Prozac and Adderall. Recent adjustment in medication regimen with psychiatrist, including a plan to reduce Prozac dosage. Adderall has improved mood and energy levels. - Continue current regimen of Prozac and Adderall as prescribed by psychiatrist. - Monitor mood and energy levels, and adjust medications as needed in consultation with psychiatrist.  Iron deficiency anemia due to chronic blood loss Iron deficiency anemia secondary to chronic blood loss from Nexplanon . Symptoms of fatigue and near syncope suggestive of anemia. Previous episodes of prolonged bleeding resolved spontaneously. - Ordered repeat labs to assess current iron levels and anemia status. - Monitor for symptoms of anemia, including fatigue and dizziness.  General Health Maintenance Up to date with dental exams. Pap smear last performed in 2022; patient is scheduled to follow up with gynecologist for repeat Pap smear. Due for eye exam. No history of STDs. No need for mammograms or colonoscopies at this age. No advanced directive or living will on file. - Will schedule eye exam. - Will administer flu vaccine. - Encouraged regular exercise and healthy diet. -Patient Counseling(The following topics were reviewed):  Preventative care handout given to pt  Health maintenance and immunizations reviewed. Please refer to Health maintenance section. Pt advised on safe sex, wearing seatbelts in car, and proper nutrition labwork ordered today for annual Dental health: Discussed importance of regular tooth brushing, flossing, and dental visits.           Follow-up: Return in about 6 months (around 06/10/2025) for f/u weight loss medication.   Ginger Patrick, FNP      [1]  Current  Outpatient Medications:    acetaminophen -caffeine  (EXCEDRIN  TENSION HEADACHE) 500-65 MG TABS per tablet, Take 2 tablets by mouth 4 (four) times daily as needed., Disp: 60 tablet, Rfl: 0   amphetamine-dextroamphetamine (ADDERALL) 15 MG tablet, Take 15 mg by mouth daily., Disp: , Rfl:    etonogestrel  (NEXPLANON ) 68 MG IMPL implant, 1 each by Subdermal route once., Disp: , Rfl:    FLUoxetine (PROZAC) 20 MG tablet, Take 20 mg by mouth daily., Disp: , Rfl:    ibuprofen  (ADVIL ) 600 MG tablet, Take 1 tablet (600 mg total) by mouth every 6 (six) hours as needed., Disp: 60 tablet, Rfl: 3   ipratropium (ATROVENT ) 0.03 % nasal spray, Place 2 sprays into both nostrils every 12 (twelve) hours., Disp: 30 mL, Rfl: 0   ondansetron  (ZOFRAN ) 4  MG tablet, Take 1 tablet (4 mg total) by mouth every 8 (eight) hours as needed for nausea or vomiting., Disp: 20 tablet, Rfl: 0   tirzepatide  (ZEPBOUND ) 10 MG/0.5ML Pen, Inject 10 mg into the skin once a week., Disp: 6 mL, Rfl: 1  "

## 2024-12-12 ENCOUNTER — Ambulatory Visit: Payer: Self-pay | Admitting: Family

## 2025-04-01 ENCOUNTER — Encounter: Admitting: Family

## 2025-05-27 ENCOUNTER — Encounter: Admitting: Family

## 2025-06-10 ENCOUNTER — Ambulatory Visit: Admitting: Family

## 2025-11-11 ENCOUNTER — Ambulatory Visit: Admitting: Family Medicine
